# Patient Record
Sex: Male | Born: 1965 | Race: Black or African American | Hispanic: No | Marital: Single | Smoking: Current every day smoker
Health system: Southern US, Community
[De-identification: ages and names within clinical notes are randomized; demographics above are authoritative.]

## PROBLEM LIST (undated history)

## (undated) DIAGNOSIS — I639 Cerebral infarction, unspecified: Secondary | ICD-10-CM

## (undated) DIAGNOSIS — F4310 Post-traumatic stress disorder, unspecified: Secondary | ICD-10-CM

## (undated) HISTORY — PX: BACK SURGERY: SHX140

## (undated) HISTORY — PX: DENTAL SURGERY: SHX609

---

## 1999-12-28 ENCOUNTER — Ambulatory Visit (HOSPITAL_COMMUNITY): Admission: RE | Admit: 1999-12-28 | Discharge: 1999-12-28 | Payer: Self-pay | Admitting: Otolaryngology

## 1999-12-28 ENCOUNTER — Encounter (INDEPENDENT_AMBULATORY_CARE_PROVIDER_SITE_OTHER): Payer: Self-pay | Admitting: Specialist

## 2000-09-04 ENCOUNTER — Ambulatory Visit (HOSPITAL_BASED_OUTPATIENT_CLINIC_OR_DEPARTMENT_OTHER): Admission: RE | Admit: 2000-09-04 | Discharge: 2000-09-04 | Payer: Self-pay | Admitting: Urology

## 2000-09-04 ENCOUNTER — Encounter (INDEPENDENT_AMBULATORY_CARE_PROVIDER_SITE_OTHER): Payer: Self-pay | Admitting: Specialist

## 2000-12-05 ENCOUNTER — Emergency Department (HOSPITAL_COMMUNITY): Admission: EM | Admit: 2000-12-05 | Discharge: 2000-12-05 | Payer: Self-pay | Admitting: *Deleted

## 2001-06-24 ENCOUNTER — Encounter: Payer: Self-pay | Admitting: Emergency Medicine

## 2001-06-24 ENCOUNTER — Emergency Department (HOSPITAL_COMMUNITY): Admission: EM | Admit: 2001-06-24 | Discharge: 2001-06-24 | Payer: Self-pay | Admitting: Emergency Medicine

## 2001-10-28 ENCOUNTER — Encounter: Admission: RE | Admit: 2001-10-28 | Discharge: 2001-10-28 | Payer: Self-pay | Admitting: Neurosurgery

## 2001-10-28 ENCOUNTER — Encounter: Payer: Self-pay | Admitting: Neurosurgery

## 2001-11-12 ENCOUNTER — Encounter: Admission: RE | Admit: 2001-11-12 | Discharge: 2001-11-12 | Payer: Self-pay | Admitting: Neurosurgery

## 2001-11-12 ENCOUNTER — Encounter: Payer: Self-pay | Admitting: Neurosurgery

## 2001-12-09 ENCOUNTER — Encounter: Admission: RE | Admit: 2001-12-09 | Discharge: 2001-12-09 | Payer: Self-pay | Admitting: Neurosurgery

## 2001-12-09 ENCOUNTER — Encounter: Payer: Self-pay | Admitting: Neurosurgery

## 2003-01-21 ENCOUNTER — Encounter: Payer: Self-pay | Admitting: Orthopaedic Surgery

## 2003-01-21 ENCOUNTER — Inpatient Hospital Stay (HOSPITAL_COMMUNITY): Admission: RE | Admit: 2003-01-21 | Discharge: 2003-01-23 | Payer: Self-pay | Admitting: Orthopaedic Surgery

## 2003-01-23 ENCOUNTER — Encounter: Payer: Self-pay | Admitting: Orthopaedic Surgery

## 2006-09-14 ENCOUNTER — Inpatient Hospital Stay (HOSPITAL_COMMUNITY): Admission: EM | Admit: 2006-09-14 | Discharge: 2006-09-19 | Payer: Self-pay | Admitting: Psychiatry

## 2006-09-14 ENCOUNTER — Ambulatory Visit: Payer: Self-pay | Admitting: Psychiatry

## 2006-09-14 ENCOUNTER — Emergency Department (HOSPITAL_COMMUNITY): Admission: EM | Admit: 2006-09-14 | Discharge: 2006-09-14 | Payer: Self-pay | Admitting: Emergency Medicine

## 2009-07-12 ENCOUNTER — Emergency Department (HOSPITAL_COMMUNITY): Admission: EM | Admit: 2009-07-12 | Discharge: 2009-07-12 | Payer: Self-pay | Admitting: Emergency Medicine

## 2009-09-14 ENCOUNTER — Emergency Department (HOSPITAL_COMMUNITY): Admission: EM | Admit: 2009-09-14 | Discharge: 2009-09-14 | Payer: Self-pay | Admitting: Emergency Medicine

## 2011-02-24 NOTE — Op Note (Signed)
[place]  Patient:    [name], [name]                   MRN: [id number] Proc. Date: 09/04/00 Adm. Date:  [id number] Attending:  Lindaann Slough                           Operative Report  PREOPERATIVE DIAGNOSIS:  Sebaceous cyst of left scrotum.  POSTOPERATIVE DIAGNOSIS:  Cyst of left scrotum.  PROCEDURE:  Excisional biopsy of left scrotal cyst.  SURGEON:  Lindaann Slough, M.D.  ANESTHESIA:  General.  INDICATION:  The patient is a 45 year old male who had noticed a swelling of his left scrotum for several months.  The swelling has been increasing in size and has been causing some discomfort.  He was found on physical examination to have a cyst of the left scrotum.  The cyst is independent from the testicle and the cord and well-delineated.  He is scheduled for excisional biopsy of the cyst.  DESCRIPTION OF PROCEDURE:  Under general anesthesia, the patient was prepped and draped and placed in the supine position.  A longitudinal incision was made on the left scrotum over the cyst.  The incision was carried down to the subcutaneous tissues.  The cyst was dissected from the surrounding tissues and excised.  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  Hemostasis was secured with electrocautery.  Then the wound was closed in three layers with 3-0 Vicryl.  The skin was then infiltrated with 0.25% Marcaine.  The patient tolerated the procedure well and left the OR in satisfactory condition to postanesthesia care unit. DD:  09/04/00 TD:  09/04/00 Job: [id number]

## 2011-02-24 NOTE — Discharge Summary (Signed)
[name], [name]            ACCOUNT NO.:  [id number]   MEDICAL RECORD NO.:  [id number]          PATIENT TYPE:  IPS   LOCATION:  0505                          [facility]ILITY[facility]  [facility]   PHYSICIAN:  Geoffery Lyons, M.D.      DATE OF BIRTH:  [birth year]   DATE OF ADMISSION:  09/14/2006  DATE OF DISCHARGE:  09/19/2006                               DISCHARGE SUMMARY   CHIEF COMPLAINT AND PRESENT ILLNESS:  This was the first admission to  [facility] for this 45 year old divorced African-  American male.  Presented to [facility] requesting  help to detox from alcohol and cocaine.  Was sent to [facility] for  clearance.  Looking for detox.  Endorsed being depressed as he could not  do it on his own.  Ended a relationship with a woman who is the mother  of his 52-year-old child.  Reported being hopeless, sleeping either at  his sister's house or at a friend's until he can find somewhere to stay.  Employed as a Museum/gallery exhibitions officer.   PAST PSYCHIATRIC HISTORY:  Denies previous treatment.   ALCOHOL/DRUG HISTORY:  Began drinking at age 1.  Drinks a 12-pack and  liquor every day.  Cocaine, began using at age 87, using five bags a day  for the past 22  years.   MEDICAL HISTORY:  Back surgery in 2003, L5,S1.   MEDICATIONS:  Naproxen.   PHYSICAL EXAMINATION:  Performed and failed to show any acute findings.   LABORATORY DATA:  TSH 3.231.  Drug screen positive for cocaine and  marijuana.  CBC with white blood cells 6.7, hemoglobin 15.0.  Blood  chemistry with sodium 131, potassium 3.7, glucose 94, BUN 14, creatinine  0.9.   MENTAL STATUS EXAM:  Alert, cooperative male.  Speech was normal in  rate, rhythm and tone.  Motor behavior is within normal limits.  He has  good eye contact.  Mood anxious.  Affect broad.  Thought processes are  clear, rational and goal-oriented.  Endorsed he wanted help, wanted to  be a good father to his son.  No evidence of delusions.  No  active  suicidal or homicidal ideation.  No hallucinations.  Cognition was well-  preserved.   ADMISSION DIAGNOSES:  AXIS I:  Alcohol and cocaine dependence.  AXIS II:  No diagnosis.  AXIS III:  Status post back surgery, herniated nucleus pulposus repair  L5, S1.  AXIS IV:  Moderate.  AXIS V:  GAF upon admission 35-40; highest GAF in the last year 60-65.   HOSPITAL COURSE:  He was admitted.  He was started in individual and  group psychotherapy.  He was given Ambien for sleep.  He was detoxified  with Librium.  He was started on Wellbutrin XL 150 mg in the morning.  He was given some Seroquel as needed.  He endorsed he was tired of  using, could not stop on his own.  There is symptomatology suggestive of  a depressive process.  He also endorsed anxiety, restlessness as he was  detoxed.  Endorsed racing thoughts.  There were tremors, shakes,  somewhat isolated and withdrawn.  He endorsed he was under some stress,  working on weekends, taking care of son with 1-year-old while his wife  works.  Wanting to stay abstinent.  Wanted to be there for his son.  The  mother, he claimed, had issues and he was wanting to get himself to a  point where he was the main caretaker.  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  Initially very anxious,  worried about where to go when he was discharged from the unit.  At  times, looking perplexed, having a hard time processing.  We pursued  detox, worked relapse prevention.  On September 18, 2006, still worried  about what will happen once he got out of the hospital but endorsed he  was starting to feel better but was clear that, if he was to go back to  the places where he was staying, he would probably go back to using.  Endorsed that, if he was to go to a residential treatment program, he  could not take care of his child, so he was conflicted.  Continued to  feel that he was going to be better served by going to the [place].   There  was still looks of being perplexed, at times seemed to be more  overwhelmed.  In the next 24 hours, there was some change in his  behavior.  He was more with it.  He was still endorsing concerns about  where to go from here but he was requesting discharge as he was worried  about his son.  On September 19, 2006, he was in full contact with  reality.  Denied any active suicidal or homicidal ideation.  Fully  detoxed.  No withdrawal.  Still worried about his son.  Would not commit  to doing anything that would keep him unable to take care of his child.  Willing to follow up on an outpatient basis.   DISCHARGE DIAGNOSES:  AXIS I:  Alcohol and cocaine dependence.  Depressive disorder not otherwise specified.  AXIS II:  No diagnosis.  AXIS III:  Status post back surgery.  AXIS IV:  Moderate.  AXIS V:  GAF upon discharge 50-55.   DISCHARGE MEDICATIONS:  1. Wellbutrin XL 150 mg per day.  2. Symmetrel 100 mg twice a day.   FOLLOWUP:  [facility].      Geoffery Lyons, M.D.  Electronically Signed     IL/MEDQ  D:  10/11/2006  T:  10/11/2006  Job:  [id number]

## 2011-02-24 NOTE — Op Note (Signed)
NAME:  [name], [name]                      ACCOUNT NO.:  [id number]   MEDICAL RECORD NO.:  [id number]                   PATIENT TYPE:  INP   LOCATION:  2550                                 FACILITY:  [facility]   PHYSICIAN:  Sharolyn Douglas, M.D.                     DATE OF BIRTH:  25-Sep-1966   DATE OF PROCEDURE:  01/20/2002  DATE OF DISCHARGE:                                 OPERATIVE REPORT   PREOPERATIVE DIAGNOSES:  1. L5-S1 herniated nucleus pulposus with chronic bilateral lower extremity     pain, left greater than right.  2. L5-S1 degenerative disk disease and lumbago.   POSTOPERATIVE DIAGNOSES:  1. L5-S1 herniated nucleus pulposus with chronic bilateral lower extremity     pain, left greater than right.  2. L5-S1 degenerative disk disease and lumbago.   PROCEDURE:  1. L5-S1 left hemilaminotomy and diskectomy with decompression of the left     L5 and S1 nerve roots.  2. Transforaminal lumbar interbody fusion with placement of an 11 mm Spinal     Concepts peak prosthetic cage.  3. Minimally invasive pedicle screw instrumentation L5-S1 utilizing the     Pathfinder system.  4. Posterior spinal arthrodesis, L5-S1.  5. Local autogenous bone graft supplemented with pro-osteal antibiotic bone     graft substitute and platelet gel.  6. Neurological monitoring utilizing triggered and free-running EMGs with     testing of 4 pedicle screws and  monitoring of free-running EMGs for 3     hours.   SURGEON:  Sharolyn Douglas, M.D.   ASSISTANT:  Junious Dresser __________ P.A.   ANESTHESIA:  General endotracheal anesthesia.   COMPLICATIONS:  None.   INDICATIONS FOR PROCEDURE:  The patient is a very pleasant 45 year old male  who injured his back in September 2002 at work. He has had a long and  appropriate course of conservative treatment with both Dr. Darcella Cheshire the  orthopedist as well as Dr. Trey Sailors, a  neurosurgeon. He has significant  back pain with bilateral lower extremity radiation  including the left  buttock and leg with radiation into the lateral thigh, leg, and foot.   Plain radiograph showed disk space narrowing at L5-S1. An MRI scan showed a  large central herniation which lateralizes to the left. There are modic  endplate changes and L5-S1 disk space narrowing as well as foraminal  stenosis, left greater than right. He has weakness in the left extensor  hallucis longus. He has been taking escalating doses of narcotics, failed  epidural steroid injections and physical therapy and has now elected to  undergo minimally invasive L5-S1 decompression and fusion in hopes of  improving his symptoms.   DESCRIPTION OF PROCEDURE:  The patient was properly identified in the  holding area and taken to the operating room. He underwent general  endotracheal anesthesia without difficulty. He was given prophylactic IV  antibiotics. He was carefully turned prone  onto the Ocumed four poster  positioning frame. All bony prominences were padded. The face and eyes were  protected at all times.   Biplanar fluoroscopy was brought into the field. We obtained true AP and  lateral images of the L5 and S1 levels. The skin was marked over the L5-S1  pedicles bilaterally.   A 4-cm incision was made bilaterally after injecting the skin with 1%  Lidocaine with epinephrine. The deep fascia was incised. We then used  Jamshidi needles to cannulate the L5 and S1 pedicles bilaterally. Guidewires  were placed through the Jamshidi needles.   Once the guidewires were in position we utilized triggered EMGs to confirm  intraosseous placement. In each case, we had stimulation of greater than 20  milliamps consistent with well placed wires within the pedicles. We then  used an awl over the wire followed by a tap. This was all done under direct  biplanar fluoroscopy.   We then placed 6.5 x 50-mm screws at L5 and 7.5 x 45-mm screws at S1. It  should be noted that after the pedicle was tapped, the  tap itself was then  stimulated using the triggered EMGs. Again we a response of greater than 20  mA in each case. After placing our pedicle screws, 5-mm rods were placed  through the chutes on the ends of the screws and the appropriate locking  caps were placed.   At this point, we turned our attention to performing a decompression and  diskectomy on the left at L5-S1. We placed the __________ retractor over the  L5-S1 interspace utilizing the previous fascial incision on the left side.  We identified the landmarks, including the lamina and L5-S1 facet joint. We  used a high speed bur to remove the inferior 1/3rd of the L5 lamina as well  as the medial portion of the L5-S1 facet joint.   The ligamentum flavum was elevated. We identified the transversing S1 nerve  root. This was gently retracted medially. The lateral recess was narrowed  secondary to the bulging disk as well as the overhanging S1 facet. The  overhanging S1 facet was removed utilizing the Kerrison punches, flush with  the S1 pedicle.   We then performed a foraminotomy at L5-S1, completely decompressing the L5  nerve root. We identified the central bulging disk and a sharp annulotomy  was performed. Using Epstein curets, we were able to push the disk back into  the interspace and remove it using pituitaries. This material appeared to be  very chronic and partially calcified. The disk material itself was very  degenerative.   We then used specialized instruments of the Spinal Concepts __________ to  perform a radical diskectomy all the way across the other side. The  vertebral endplates were scraped and the cartilage was removed.   At this point, we performed a transforaminal lumbar interbody fusion. We  copiously irrigated the disk space. We then collected the local autogenous  bone which had been cleaned and morselized from the lamina and the  facetectomy. This was mixed with 10 mL of __________ bone graft  substitute. A log was made utilizing the platelet gel.  A funnel was utilized to tightly  pack the disk space with the bone graft. It became evident that the disk  space was completely packed with the bone graft material.   We then placed an 11 mm peak prosthetic spacer into the disk space and  carefully tamped it across to the midline. AP and lateral fluoroscopy  was  utilized to confirm good positioning of the prosthesis. We once again  evaluated the L5 and S1 nerve roots now that they were free. The wound was  copiously irrigated. Gelfoam was left over the exposed epidural space.   We then turned our attention to performing a posterior fusion on the right  side of the L5 -S1 facet joint. The joint capsule was removed. The  articulate cartilage was removed using the high-speed bur. The remaining  bone graft was packed over the facet joint on the right.   We applied general compression across the L5-S1 level utilizing the pedicle  screws and rods. The final caps were tightened and sheared off. We took  final AP and lateral images which showed excellent position of the pedicle  screws and interbody prosthesis. It should be noted that throughout the  procedure the free running EMGs were monitored. There were no deleterious  changes.   The 2 small incisions were then closed using a #1 Vicryl on the fascia  followed by 2-0 Vicryl in the subcutaneous layer and then a running 3-0  subcuticular Vicryl suture on the skin. Benzoin and Steri-Strips were  placed. A sterile dressing was applied.   The patient was turned supine and extubated without difficulty. He was  transferred to the recovery room in stable condition. He was able to move  his upper and lower extremities.                                               Sharolyn Douglas, M.D.    MC/MEDQ  D:  01/21/2003  T:  01/21/2003  Job:  [id number]

## 2011-02-24 NOTE — H&P (Signed)
NAME:  [name], [name]                      ACCOUNT NO.:  [id number]   MEDICAL RECORD NO.:  [id number]                   PATIENT TYPE:  INP   LOCATION:  2550                                 FACILITY:  [facility]   PHYSICIAN:  Sharolyn Douglas, M.D.                     DATE OF BIRTH:  07-26-1966   DATE OF ADMISSION:  01/21/2003  DATE OF DISCHARGE:                                HISTORY & PHYSICAL   CHIEF COMPLAINT:  Back and left lower extremity pain.   HISTORY OF PRESENT ILLNESS:  The patient is a 45 year old male who has been  having low back as well as left lower extremity pain for greater than two  years now following an injury that did occur at work.  The pain has gotten  to the point that it is progressively getting worse.  It has failed numerous  __________  management including activity modification, anti-inflammatory  medications, pain medications, injections, as well as physical therapy.  The  pain has gotten more severe in nature.  It is constant.  It is significantly  affecting his quality of life as well as activities of daily living.  He has  been unable to work secondary to this pain.  The risks and benefits of  surgery were discussed with patient by Dr. Sharolyn Douglas.  He indicated  understanding and opts to proceed.   ALLERGIES:  No known drug allergies.   MEDICATIONS:  1. Xanax p.r.n.  2. Percocet p.r.n.   PAST MEDICAL HISTORY:  Healthy.   PAST SURGICAL HISTORY:  Nasal surgery.   SOCIAL HISTORY:  The patient quit smoking on 01/09/03.  Denies alcohol use.  He is married and has two children.  His family is going to be available to  help him through his postoperative course.   FAMILY MEDICAL HISTORY:  Noncontributory.   REVIEW OF SYSTEMS:  CONSTITUTIONAL:  The patient denies any fevers, chills,  sweats, or bleeding gums.  CNS:  Denies blurred vision, double vision,  seizures, headaches.  CARDIOVASCULAR:  Denies chest pain, angina, orthopnea,  claudication, or  palpitations.  PULMONARY:  Denies shortness of breath,  productive cough, hemoptysis.  GI:  Denies nausea, vomiting, constipation,  diarrhea, melena, or bloody stool.  GU:  Denies dysuria, hematuria or  discharge.  MUSCULOSKELETAL:  As per HPI.   PHYSICAL EXAMINATION:  VITAL SIGNS:  Blood pressure 120/86, respirations 16  and unlabored, pulse 86 and regular.  GENERAL:  The patient is a 45 year old black male who is alert, oriented and  in no acute distress.  He is well nourished and well groomed.  Appears  stated age.  Pleasant and cooperative through the examination.  He does have  difficulty moving about the examination room secondary to pain.  HEENT:  Head:  Normocephalic and atraumatic.  Pupils equally round and  reactive.  Extraocular movements intact.  Nares patent.  Pharynx clear.  NECK:  Soft to palpation.  No bruits appreciated.  No lymphadenopathy or  thyromegaly noted.  CHEST:  Clear to auscultation bilaterally.  No rales, rhonchi, stridor,  wheezes.  BREASTS:  Not pertinent, not performed.  HEART:  S1/S2 regular rate and rhythm.  No murmurs, gallops or rubs noted.  ABDOMEN:  Soft to palpation.  Positive bowel sounds.  Nontender,  nondistended, no organomegaly noted.  GU:  Not pertinent, not performed.  EXTREMITIES:  Motor function grossly intact.  Sensation grossly intact.  See  notes for a detailed exam.  He does have left lower extremity pain, positive  dorsalis pedis and posterior tibialis pulses.  SKIN:  Intact without lesions or rashes.   RADIOLOGIC DATA:  X-ray and MRI show degenerative disk disease as well as an  HNP at L5-S1.   IMPRESSION:  Degenerative disk disease and a herniated nucleus pulposis at  L5-S1.   PLAN:  Admit to [facility] on 01/21/03 for a minimally-invasive  posterior spinal infusion at L5-S1 with a __________  procedure and this  will be done by Dr. Sharolyn Douglas.     Verlin Fester, P.A.                       Sharolyn Douglas, M.D.     CM/MEDQ  D:  01/21/2003  T:  01/21/2003  Job:  [id number]

## 2011-02-24 NOTE — H&P (Signed)
[name], [name]            ACCOUNT NO.:  [id number]   MEDICAL RECORD NO.:  [id number]          PATIENT TYPE:  IPS   LOCATION:  0505                          [facility]ILITY[facility]  [facility]   PHYSICIAN:  [name], M.D.      DATE OF BIRTH:  Aug 29, 1966   DATE OF ADMISSION:  09/14/2006  DATE OF DISCHARGE:                       PSYCHIATRIC ADMISSION ASSESSMENT   This is a voluntary admission to the services of Dr. Geoffery Bullock.   IDENTIFYING INFORMATION:  This is a 45 year old divorced African-  American male.  Apparently he presented to [facility] mental health  yesterday requesting help to detox from alcohol and cocaine.  He was  sent to the [name] for medical clearance.  He is looking for a  detox and is depressed because he can not do it on his own.  He recently  ended a relationship with a woman who is the mother of his 4-year-old  child.  He reports being homeless, sleeping either at his sister's house  or at a friend's until he can find somewhere to stay.  He is employed as  a Museum/gallery exhibitions officer.   PAST PSYCHIATRIC HISTORY:  He has no prior in or outpatient treatment.   SOCIAL HISTORY:  He graduated high school in 1983.  His is currently  employed as a Estate agent.  He is married x1 and divorced.  He  does have a 41 year old daughter, and a 84 year old daughter from that  marriage.  He currently has a 74-year-old son from his girlfriend.   FAMILY HISTORY:  He states all the men drink heavily.  He himself began  drinking at age 55.  He acknowledges drinking a 12-pack and liquor every  day.  Cocaine, he began using at age 82.  He is using 5 bags a day for  the past 22 years.   PRIMARY CARE [name]:  [facility] [place].  He is on the [facility] Team in [place].   MEDICAL PROBLEMS:  He did have back surgery here at [facility] January 20, 2002.  He had an HNP at L5-S1.   MEDICATIONS:  He states he currently takes naproxen from the [place].   DRUG ALLERGIES:  NO KNOWN DRUG  ALLERGIES.   POSITIVE PHYSICAL FINDINGS:  He was cleared in the ED at [facility].  His alcohol level was less than 5.  His urine drug screen was positive  for cocaine and marijuana.  His other labs were within normal limits,  and his vital signs on admission to our unit show he is 72 inches tall,  weight is 168, temperature is 97.3, blood pressure is 135/101 to 130/84,  pulse 70, and respirations are 20.  He does have a back surgery scar in  the low lumbar area, and he had no other remarkable findings at this  time.   MENTAL STATUS EXAM:  He is alert and oriented x3.  His speech is normal  rate, rhythm, and tone.  His motor is normal.  He had good eye contact.  His mood is appropriate to the situation.  His affect has a range.  Thought processes  are clear, rational, and goal-oriented.  He was to get  help.  He wants to be a good father to his son.  Judgment and insight  are intact.  Concentration and memory are intact.  Intelligence is at  least average.  He denies being suicidal or homicidal.  He denies  auditory/visual hallucinations.   DIAGNOSES:  Axis I:  Alcohol and cocaine dependence.  Axis II:  No diagnosis.  Axis III:  Herniated nucleus pulposus repair, L5-S1, January 20, 2002.  Axis IV:  He is currently homeless.  Axis V:  60.   PLAN:  Admit to support through detox.  Toward that end, we will go  ahead and start him on the low-dose Librium protocol.  He is a Cytogeneticist.  We can attempt to get him into the [facility] program in [place].  Also,  they have a homeless vet program, and we will let the case managers work  on that.  We can go ahead and start some Wellbutrin to address his  depression, and to help with withdrawal from smoking.      Mickie Leonarda Salon, P.A.-C.      [name], M.D.  Electronically Signed    MD/MEDQ  D:  09/15/2006  T:  09/15/2006  Job:  [id number]

## 2011-03-02 ENCOUNTER — Emergency Department (HOSPITAL_COMMUNITY): Payer: Non-veteran care

## 2011-03-02 ENCOUNTER — Emergency Department (HOSPITAL_COMMUNITY)
Admission: EM | Admit: 2011-03-02 | Discharge: 2011-03-02 | Disposition: A | Discharge status: Home / Self Care | Payer: Non-veteran care | Attending: Emergency Medicine | Admitting: Emergency Medicine

## 2011-03-02 DIAGNOSIS — IMO0002 Reserved for concepts with insufficient information to code with codable children: Secondary | ICD-10-CM | POA: Insufficient documentation

## 2011-03-02 DIAGNOSIS — M545 Low back pain, unspecified: Secondary | ICD-10-CM | POA: Insufficient documentation

## 2011-03-02 DIAGNOSIS — M25519 Pain in unspecified shoulder: Secondary | ICD-10-CM | POA: Insufficient documentation

## 2011-03-02 DIAGNOSIS — S335XXA Sprain of ligaments of lumbar spine, initial encounter: Secondary | ICD-10-CM | POA: Insufficient documentation

## 2011-06-22 ENCOUNTER — Emergency Department (HOSPITAL_COMMUNITY): Payer: Worker's Compensation

## 2011-06-22 ENCOUNTER — Emergency Department (HOSPITAL_COMMUNITY)
Admission: EM | Admit: 2011-06-22 | Discharge: 2011-06-22 | Disposition: A | Discharge status: Home / Self Care | Payer: Worker's Compensation | Attending: Emergency Medicine | Admitting: Emergency Medicine

## 2011-06-22 DIAGNOSIS — R110 Nausea: Secondary | ICD-10-CM | POA: Insufficient documentation

## 2011-06-22 DIAGNOSIS — G8929 Other chronic pain: Secondary | ICD-10-CM | POA: Insufficient documentation

## 2011-06-22 DIAGNOSIS — R51 Headache: Secondary | ICD-10-CM | POA: Insufficient documentation

## 2011-06-22 DIAGNOSIS — R4701 Aphasia: Secondary | ICD-10-CM | POA: Insufficient documentation

## 2011-06-22 DIAGNOSIS — M549 Dorsalgia, unspecified: Secondary | ICD-10-CM | POA: Insufficient documentation

## 2011-06-22 DIAGNOSIS — R42 Dizziness and giddiness: Secondary | ICD-10-CM | POA: Insufficient documentation

## 2011-06-22 DIAGNOSIS — I10 Essential (primary) hypertension: Secondary | ICD-10-CM | POA: Insufficient documentation

## 2011-06-22 LAB — URINALYSIS, ROUTINE W REFLEX MICROSCOPIC
Glucose, UA: NEGATIVE mg/dL
Leukocytes, UA: NEGATIVE
Nitrite: NEGATIVE
Specific Gravity, Urine: 1.004 — ABNORMAL LOW (ref 1.005–1.030)
pH: 6.5 (ref 5.0–8.0)

## 2011-06-22 LAB — PRO B NATRIURETIC PEPTIDE: Pro B Natriuretic peptide (BNP): 13.4 pg/mL (ref 0–125)

## 2011-06-22 LAB — DIFFERENTIAL
Basophils Relative: 1 % (ref 0–1)
Eosinophils Absolute: 0 10*3/uL (ref 0.0–0.7)
Lymphs Abs: 2.1 10*3/uL (ref 0.7–4.0)
Neutro Abs: 2.2 10*3/uL (ref 1.7–7.7)
Neutrophils Relative %: 48 % (ref 43–77)

## 2011-06-22 LAB — COMPREHENSIVE METABOLIC PANEL
ALT: 22 U/L (ref 0–53)
AST: 26 U/L (ref 0–37)
Albumin: 4.1 g/dL (ref 3.5–5.2)
Alkaline Phosphatase: 63 U/L (ref 39–117)
Chloride: 98 mEq/L (ref 96–112)
Creatinine, Ser: 0.85 mg/dL (ref 0.50–1.35)
Potassium: 4.4 mEq/L (ref 3.5–5.1)
Sodium: 137 mEq/L (ref 135–145)
Total Bilirubin: 0.3 mg/dL (ref 0.3–1.2)

## 2011-06-22 LAB — CBC
Hemoglobin: 14.3 g/dL (ref 13.0–17.0)
Platelets: 163 10*3/uL (ref 150–400)
RBC: 5.16 MIL/uL (ref 4.22–5.81)
WBC: 4.6 10*3/uL (ref 4.0–10.5)

## 2012-10-01 ENCOUNTER — Emergency Department (HOSPITAL_COMMUNITY)
Admission: EM | Admit: 2012-10-01 | Discharge: 2012-10-01 | Discharge status: Against Medical Advice | Payer: Non-veteran care | Attending: Emergency Medicine | Admitting: Emergency Medicine

## 2012-10-01 ENCOUNTER — Emergency Department (HOSPITAL_COMMUNITY)
Admission: EM | Admit: 2012-10-01 | Discharge: 2012-10-01 | Disposition: A | Discharge status: Home / Self Care | Payer: Non-veteran care | Source: Home / Self Care | Attending: Emergency Medicine | Admitting: Emergency Medicine

## 2012-10-01 ENCOUNTER — Encounter (HOSPITAL_COMMUNITY): Payer: Self-pay | Admitting: *Deleted

## 2012-10-01 ENCOUNTER — Encounter (HOSPITAL_COMMUNITY): Payer: Self-pay | Admitting: Cardiology

## 2012-10-01 ENCOUNTER — Emergency Department (HOSPITAL_COMMUNITY): Payer: Non-veteran care

## 2012-10-01 DIAGNOSIS — S0083XA Contusion of other part of head, initial encounter: Secondary | ICD-10-CM

## 2012-10-01 DIAGNOSIS — S0993XA Unspecified injury of face, initial encounter: Secondary | ICD-10-CM

## 2012-10-01 DIAGNOSIS — Y929 Unspecified place or not applicable: Secondary | ICD-10-CM | POA: Insufficient documentation

## 2012-10-01 DIAGNOSIS — F17200 Nicotine dependence, unspecified, uncomplicated: Secondary | ICD-10-CM | POA: Insufficient documentation

## 2012-10-01 DIAGNOSIS — X58XXXA Exposure to other specified factors, initial encounter: Secondary | ICD-10-CM | POA: Insufficient documentation

## 2012-10-01 DIAGNOSIS — S022XXA Fracture of nasal bones, initial encounter for closed fracture: Secondary | ICD-10-CM | POA: Insufficient documentation

## 2012-10-01 DIAGNOSIS — Y939 Activity, unspecified: Secondary | ICD-10-CM | POA: Insufficient documentation

## 2012-10-01 DIAGNOSIS — S298XXA Other specified injuries of thorax, initial encounter: Secondary | ICD-10-CM | POA: Insufficient documentation

## 2012-10-01 DIAGNOSIS — S1093XA Contusion of unspecified part of neck, initial encounter: Secondary | ICD-10-CM | POA: Insufficient documentation

## 2012-10-01 DIAGNOSIS — S0003XA Contusion of scalp, initial encounter: Secondary | ICD-10-CM | POA: Insufficient documentation

## 2012-10-01 MED ORDER — HYDROCODONE-ACETAMINOPHEN 5-325 MG PO TABS: 2.0000 | ORAL_TABLET | ORAL | Status: DC | PRN
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
## 2012-10-01 NOTE — ED Notes (Signed)
Went to [facility], sleeping on stretcher at present,

## 2012-10-01 NOTE — ED Notes (Signed)
Pt escorted to CT

## 2012-10-01 NOTE — ED Notes (Signed)
Facial swelling with cuts anf his rt eye is totally swelled  Together.  He has a loose upper central or lateral incisor.  He has just walked out from the back and checked back in for the same
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
## 2012-10-01 NOTE — ED Provider Notes (Signed)
The patient returned after eloping an hour earlier.  He had his tests performed and vision tested.  Optho consult was obtained over the phone.  He is to follow up with them on the 26th.  Geoffery Lyons, MD 10/01/12 [phone]

## 2012-10-01 NOTE — ED Notes (Signed)
MD at bedside. 
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
## 2012-10-01 NOTE — ED Notes (Signed)
The patient is AOx4 and comfortable with his discharge instructions. 

## 2012-11-04 ENCOUNTER — Emergency Department (HOSPITAL_COMMUNITY)
Admission: EM | Admit: 2012-11-04 | Discharge: 2012-11-04 | Disposition: A | Discharge status: Home / Self Care | Payer: Non-veteran care | Attending: Emergency Medicine | Admitting: Emergency Medicine

## 2012-11-04 ENCOUNTER — Encounter (HOSPITAL_COMMUNITY): Payer: Self-pay | Admitting: *Deleted

## 2012-11-04 DIAGNOSIS — Z79899 Other long term (current) drug therapy: Secondary | ICD-10-CM | POA: Insufficient documentation

## 2012-11-04 DIAGNOSIS — F17200 Nicotine dependence, unspecified, uncomplicated: Secondary | ICD-10-CM | POA: Insufficient documentation

## 2012-11-04 DIAGNOSIS — G8929 Other chronic pain: Secondary | ICD-10-CM | POA: Insufficient documentation

## 2012-11-04 DIAGNOSIS — Z9889 Other specified postprocedural states: Secondary | ICD-10-CM | POA: Insufficient documentation

## 2012-11-04 DIAGNOSIS — M545 Low back pain, unspecified: Secondary | ICD-10-CM | POA: Insufficient documentation

## 2012-11-04 MED ORDER — OXYCODONE-ACETAMINOPHEN 5-325 MG PO TABS
1.0000 | ORAL_TABLET | Freq: Once | ORAL | Status: AC
  Administered 2012-11-04: 1 via ORAL
  Filled 2012-11-04: qty 1

## 2012-11-04 MED ORDER — OXYCODONE-ACETAMINOPHEN 5-325 MG PO TABS: 1.0000 | ORAL_TABLET | ORAL | Status: DC | PRN

## 2012-11-04 NOTE — ED Provider Notes (Signed)
History  This chart was scribed for Dione Booze, MD by Marlin Canary ED Scribe. The patient was seen in room TR09C/TR09C. Patient's care was started at 2237.  CSN: [id number]  Arrival date & time 11/04/12  2207   First MD Initiated Contact with Patient 11/04/12 2237      Chief Complaint  Patient presents with  . Back Pain   The history is provided by the patient. No language interpreter was used.   [name] is a 47 y.o. male with chronic back pain who presents to the Emergency Department complaining of constant severe left lower back pain. He reports a h/o a back surgery and states his pain flared up about 3 hours ago. He states the pain radiates down his left leg which is normal for his back pain. His pain is rated 10/10 at it's worst. His pain is aggravated with movement and relieved by laying down. He denies any recent falls, injuries or heavy lifting. He denies any weakness, numbness or tingling. He normally takes Flexeril and Meloxicam for his back pain.  History reviewed. No pertinent past medical history.  Past Surgical History  Procedure Date  . Back surgery     Family History  Problem Relation Age of Onset  . Depression Father     History  Substance Use Topics  . Smoking status: Current Every Day Smoker -- 0.2 packs/day for 34 years  . Smokeless tobacco: Never Used  . Alcohol Use: 3.6 oz/week    6 Cans of beer per week      Review of Systems  Musculoskeletal: Positive for back pain. Negative for gait problem.  Neurological: Negative for weakness and numbness.  All other systems reviewed and are negative.    Allergies  Bee venom and Citrus  Home Medications   Current Outpatient Rx  Name  Route  Sig  Dispense  Refill  . CYCLOBENZAPRINE HCL 10 MG PO TABS   Oral   Take 10 mg by mouth at bedtime.         . MELOXICAM 15 MG PO TABS   Oral   Take 15 mg by mouth daily.           BP 144/112  Pulse 102  Temp 98.3 F (36.8 C) (Oral)   Resp 16  SpO2 100%  Physical Exam  Nursing note and vitals reviewed. Constitutional: He is oriented to person, place, and time. He appears well-developed and well-nourished. No distress.  HENT:  Head: Normocephalic and atraumatic.  Eyes: Conjunctivae normal and EOM are normal.  Neck: Neck supple. No tracheal deviation present.  Cardiovascular: Normal rate, regular rhythm and normal heart sounds.   Pulmonary/Chest: Effort normal and breath sounds normal. No respiratory distress.  Musculoskeletal: Normal range of motion. He exhibits tenderness.       Mild left para lumbar tenderness and muscle spasms. Positive SLR on the left at 45 degrees.   Neurological: He is alert and oriented to person, place, and time.  Skin: Skin is warm and dry.  Psychiatric: He has a normal mood and affect. His behavior is normal.    ED Course  Procedures (including critical care time) DIAGNOSTIC STUDIES: Oxygen Saturation is 100% on room air, Normal  by my interpretation.    COORDINATION OF CARE: 2245-Patient informed of current plan for treatment and evaluation and agrees with plan at this time. Will d/c pt home with prescription for Percocet.    1. Exacerbation of chronic back pain  MDM  Exacerbation of chronic back pain. His old records are reviewed and he does not use the ED for back problems her frequently. His record on the [place] controlled substance reporting website was reviewed and he has no narcotic prescriptions the last 6 months. He is already on NSAIDs and muscle relaxers. He is sent home with a prescription for Percocet.   I personally performed the services described in this documentation, which was scribed in my presence. The recorded information has been reviewed and is accurate.      Dione Booze, MD 11/04/12 [phone]

## 2012-11-04 NOTE — ED Notes (Signed)
Pt reports chronic back pain.  Pt unable to get into the [place] so he came to the ED.  (L) back pain radiating down into his legs.

## 2012-11-23 ENCOUNTER — Other Ambulatory Visit: Payer: Self-pay

## 2013-08-14 ENCOUNTER — Other Ambulatory Visit: Payer: Self-pay

## 2017-11-09 ENCOUNTER — Emergency Department (HOSPITAL_COMMUNITY): Payer: Medicaid Other

## 2017-11-09 ENCOUNTER — Emergency Department (HOSPITAL_COMMUNITY)
Admission: EM | Admit: 2017-11-09 | Discharge: 2017-11-09 | Disposition: A | Discharge status: Home / Self Care | Payer: Medicaid Other | Attending: Emergency Medicine | Admitting: Emergency Medicine

## 2017-11-09 ENCOUNTER — Encounter (HOSPITAL_COMMUNITY): Payer: Self-pay | Admitting: Emergency Medicine

## 2017-11-09 DIAGNOSIS — F17210 Nicotine dependence, cigarettes, uncomplicated: Secondary | ICD-10-CM | POA: Insufficient documentation

## 2017-11-09 DIAGNOSIS — Y929 Unspecified place or not applicable: Secondary | ICD-10-CM | POA: Insufficient documentation

## 2017-11-09 DIAGNOSIS — W1830XA Fall on same level, unspecified, initial encounter: Secondary | ICD-10-CM | POA: Insufficient documentation

## 2017-11-09 DIAGNOSIS — S40012A Contusion of left shoulder, initial encounter: Secondary | ICD-10-CM | POA: Insufficient documentation

## 2017-11-09 DIAGNOSIS — Y939 Activity, unspecified: Secondary | ICD-10-CM | POA: Diagnosis not present

## 2017-11-09 DIAGNOSIS — S4992XA Unspecified injury of left shoulder and upper arm, initial encounter: Secondary | ICD-10-CM | POA: Diagnosis present

## 2017-11-09 DIAGNOSIS — Y999 Unspecified external cause status: Secondary | ICD-10-CM | POA: Insufficient documentation

## 2017-11-09 MED ORDER — CYCLOBENZAPRINE HCL 10 MG PO TABS
10.0000 mg | ORAL_TABLET | Freq: Once | ORAL | Status: AC
  Administered 2017-11-09: 10 mg via ORAL
  Filled 2017-11-09: qty 1

## 2017-11-09 MED ORDER — CYCLOBENZAPRINE HCL 10 MG PO TABS: 10.0000 mg | ORAL_TABLET | Freq: Two times a day (BID) | ORAL | 0 refills | Status: DC | PRN

## 2017-11-09 MED ORDER — OXYCODONE-ACETAMINOPHEN 5-325 MG PO TABS
1.0000 | ORAL_TABLET | Freq: Once | ORAL | Status: AC
  Administered 2017-11-09: 1 via ORAL
  Filled 2017-11-09: qty 1

## 2017-11-09 NOTE — Discharge Instructions (Signed)
[facility] continue to take your meloxicam as directed. Only wear the sling for a few days and be sure to do the rang of motion exercises.

## 2017-11-09 NOTE — ED Triage Notes (Signed)
Patient reports that he fell 3-4 days ago and landed on left shoulder. Patient having pains in left shoulder esp with movement. Patient also reports that now his right shoulder is starting to hurt with movement.

## 2017-11-09 NOTE — ED Provider Notes (Signed)
[facility]-EMERGENCY DEPT Provider Note   CSN: [id number] Arrival date & time: 11/09/17  1214     History   Chief Complaint Chief Complaint  Patient presents with  . Shoulder Pain    HPI [name] is a 52 y.o. male who presents to the ED with shoulder pain. The pain started 4 days ago after a fall. The pain is located in the left shoulder. The pain increases with movement. Patient reports that he has problems with his back and knees and that is why he fell. He is treated at [facility] with Mobic and has been taking it with only minimal relief. Patient reports he is also treated with Percocet and Flexeril but he is out of those medications. Patient reports that his BP is up today but he thinks due to pain. He doctor monitors his BP and has not started him on medication.   The history is provided by the patient. No language interpreter was used.  Shoulder Pain   This is a new problem. The current episode started more than 2 days ago. The problem has been gradually worsening. The pain is present in the left shoulder. The pain is moderate. There has been a history of trauma.    History reviewed. No pertinent past medical history.  There are no active problems to display for this patient.   Past Surgical History:  Procedure Laterality Date  . BACK SURGERY         Home Medications    Prior to Admission medications   Medication Sig Start Date End Date Taking? Authorizing Provider  cyclobenzaprine (FLEXERIL) 10 MG tablet Take 1 tablet (10 mg total) by mouth 2 (two) times daily as needed for muscle spasms. 11/09/17   Janne Napoleon, NP  meloxicam (MOBIC) 15 MG tablet Take 15 mg by mouth daily.    [provider]  oxyCODONE-acetaminophen (PERCOCET/ROXICET) 5-325 MG per tablet Take 1 tablet by mouth every 4 (four) hours as needed for pain. 11/04/12   Dione Booze, MD    Family History Family History  Problem Relation Age of Onset  . Depression Father       Social History Social History   Tobacco Use  . Smoking status: Current Every Day Smoker    Packs/day: 0.20    Years: 34.00    Pack years: 6.80  . Smokeless tobacco: Never Used  Substance Use Topics  . Alcohol use: Yes    Alcohol/week: 3.6 oz    Types: 6 Cans of beer per week  . Drug use: No     Allergies   Bee venom and Citrus   Review of Systems Review of Systems  Constitutional: Negative for chills and fever.  HENT: Negative.   Eyes: Negative for pain, itching and visual disturbance.  Respiratory: Negative for cough and shortness of breath.   Gastrointestinal: Negative for abdominal pain, nausea and vomiting.  Genitourinary: Negative for dysuria, frequency and urgency.  Musculoskeletal: Positive for arthralgias. Back pain: chronic   Skin: Negative for wound.  Neurological: Negative for syncope and headaches.  Psychiatric/Behavioral: Negative for confusion.     Physical Exam Updated Vital Signs BP (!) 152/109 (BP Location: Right Arm)   Pulse 70   Temp 97.6 F (36.4 C) (Oral)   Resp 17   SpO2 99%   Physical Exam  Constitutional: He is oriented to person, place, and time. He appears well-developed and well-nourished. No distress.  HENT:  Head: Normocephalic and atraumatic.  Eyes: EOM are  normal.  Neck: Neck supple.  Cardiovascular: Normal rate.  Pulmonary/Chest: Effort normal.  Abdominal: Soft. There is no tenderness.  Musculoskeletal:       Left shoulder: He exhibits decreased range of motion, tenderness and spasm. He exhibits no effusion, no crepitus, no deformity, no laceration, normal pulse and normal strength.  Radial pulses 2+, adequate circulation, grips are equal. Pain with passive rang of motion of the shoulder.   Neurological: He is alert and oriented to person, place, and time. No cranial nerve deficit.  Skin: Skin is warm and dry.  Psychiatric: He has a normal mood and affect.  Nursing note and vitals reviewed.    ED Treatments /  Results  Labs (all labs ordered are listed, but only abnormal results are displayed) Labs Reviewed - No data to display  Radiology Dg Shoulder Left  Result Date: 11/09/2017 CLINICAL DATA:  Fall 3-4 days ago.  Landed on LEFT shoulder. EXAM: LEFT SHOULDER - 2+ VIEW COMPARISON:  None. FINDINGS: There is no evidence of fracture or dislocation. There is no evidence of arthropathy or other focal bone abnormality. Soft tissues are unremarkable. IMPRESSION: Negative. Electronically Signed   By: Elsie StainJohn T Curnes M.D.   On: 11/09/2017 13:06    Procedures Procedures (including critical care time)  Medications Ordered in ED Medications  oxyCODONE-acetaminophen (PERCOCET/ROXICET) 5-325 MG per tablet 1 tablet (1 tablet Oral Given 11/09/17 1253)  cyclobenzaprine (FLEXERIL) tablet 10 mg (10 mg Oral Given 11/09/17 1253)     Initial Impression / Assessment and Plan / ED Course  I have reviewed the triage vital signs and the nursing notes. SUBJECTIVE: [name] is a 52 y.o. male who sustained a left shoulder injury 4 day(s) ago. Mechanism of injury: fall Immediate symptoms: pain and increased pain since that time.  OBJECTIVE: Vital signs as noted above. Discussed with patient elevated BP and need for f/u. Appearance: alert, well appearing, and in no distress. Shoulder exam: soft tissue tenderness, muscle spasm and, reduced range of motion of the left shoulder. X-ray: no fracture or dislocation noted.  ASSESSMENT: Shoulder pain s/p fall.   PLAN: rest the injured area as much as practical, apply ice packs, use a sling and f/u with PCP. Patient agrees with plan.    Final Clinical Impressions(s) / ED Diagnoses   Final diagnoses:  Contusion of left shoulder, initial encounter    ED Discharge Orders        Ordered    cyclobenzaprine (FLEXERIL) 10 MG tablet  2 times daily PRN     11/09/17 1335       Kerrie Buffaloeese, Galen Malkowski LincolnM, NP 11/09/17 1344    Azalia Bilisampos, Kevin, MD 11/09/17 1614

## 2018-09-21 ENCOUNTER — Encounter (HOSPITAL_COMMUNITY): Payer: Self-pay

## 2018-09-21 ENCOUNTER — Other Ambulatory Visit: Payer: Self-pay

## 2018-09-21 ENCOUNTER — Emergency Department (HOSPITAL_COMMUNITY): Payer: Non-veteran care

## 2018-09-21 ENCOUNTER — Emergency Department (HOSPITAL_COMMUNITY)
Admission: EM | Admit: 2018-09-21 | Discharge: 2018-09-21 | Disposition: A | Discharge status: Home / Self Care | Payer: Non-veteran care | Attending: Emergency Medicine | Admitting: Emergency Medicine

## 2018-09-21 DIAGNOSIS — S60211A Contusion of right wrist, initial encounter: Secondary | ICD-10-CM | POA: Insufficient documentation

## 2018-09-21 DIAGNOSIS — Y9301 Activity, walking, marching and hiking: Secondary | ICD-10-CM | POA: Insufficient documentation

## 2018-09-21 DIAGNOSIS — Y999 Unspecified external cause status: Secondary | ICD-10-CM | POA: Diagnosis not present

## 2018-09-21 DIAGNOSIS — S0003XA Contusion of scalp, initial encounter: Secondary | ICD-10-CM | POA: Insufficient documentation

## 2018-09-21 DIAGNOSIS — W19XXXA Unspecified fall, initial encounter: Secondary | ICD-10-CM

## 2018-09-21 DIAGNOSIS — S0990XA Unspecified injury of head, initial encounter: Secondary | ICD-10-CM | POA: Diagnosis present

## 2018-09-21 DIAGNOSIS — F17200 Nicotine dependence, unspecified, uncomplicated: Secondary | ICD-10-CM | POA: Insufficient documentation

## 2018-09-21 DIAGNOSIS — Y92008 Other place in unspecified non-institutional (private) residence as the place of occurrence of the external cause: Secondary | ICD-10-CM | POA: Diagnosis not present

## 2018-09-21 DIAGNOSIS — W109XXA Fall (on) (from) unspecified stairs and steps, initial encounter: Secondary | ICD-10-CM | POA: Diagnosis not present

## 2018-09-21 DIAGNOSIS — Z79899 Other long term (current) drug therapy: Secondary | ICD-10-CM | POA: Diagnosis not present

## 2018-09-21 DIAGNOSIS — S93402A Sprain of unspecified ligament of left ankle, initial encounter: Secondary | ICD-10-CM | POA: Insufficient documentation

## 2018-09-21 MED ORDER — IBUPROFEN 800 MG PO TABS
800.0000 mg | ORAL_TABLET | Freq: Once | ORAL | Status: AC
  Administered 2018-09-21: 800 mg via ORAL
  Filled 2018-09-21: qty 1

## 2018-09-21 NOTE — ED Triage Notes (Addendum)
Patient reports that he fell down some steps,but does not know how many and won't elaborate. Patient c/o left foot and ankle, left and left knee pain, and left head pain.  Patient is very uncooperative. Patient smells of ETOH.
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
## 2018-09-21 NOTE — ED Notes (Signed)
Patient transported to CT 

## 2018-09-21 NOTE — ED Notes (Signed)
Pt ambulated by self. Gait steady

## 2018-09-21 NOTE — ED Notes (Addendum)
Pt verbalized discharge paperwork and follow up care. Family is taking him home. Ambulatory with steady gait.

## 2019-08-20 ENCOUNTER — Emergency Department (HOSPITAL_COMMUNITY): Payer: No Typology Code available for payment source

## 2019-08-20 ENCOUNTER — Inpatient Hospital Stay (HOSPITAL_COMMUNITY)
Admission: EM | Admit: 2019-08-20 | Discharge: 2019-08-21 | DRG: 066 | Disposition: A | Discharge status: Home Health | Payer: No Typology Code available for payment source | Attending: Family Medicine | Admitting: Family Medicine

## 2019-08-20 ENCOUNTER — Encounter (HOSPITAL_COMMUNITY): Payer: Self-pay

## 2019-08-20 ENCOUNTER — Other Ambulatory Visit: Payer: Self-pay

## 2019-08-20 DIAGNOSIS — F17210 Nicotine dependence, cigarettes, uncomplicated: Secondary | ICD-10-CM | POA: Diagnosis present

## 2019-08-20 DIAGNOSIS — Z79891 Long term (current) use of opiate analgesic: Secondary | ICD-10-CM

## 2019-08-20 DIAGNOSIS — Z791 Long term (current) use of non-steroidal anti-inflammatories (NSAID): Secondary | ICD-10-CM

## 2019-08-20 DIAGNOSIS — Z818 Family history of other mental and behavioral disorders: Secondary | ICD-10-CM | POA: Diagnosis not present

## 2019-08-20 DIAGNOSIS — F4310 Post-traumatic stress disorder, unspecified: Secondary | ICD-10-CM | POA: Diagnosis present

## 2019-08-20 DIAGNOSIS — I6389 Other cerebral infarction: Secondary | ICD-10-CM | POA: Diagnosis not present

## 2019-08-20 DIAGNOSIS — R29810 Facial weakness: Secondary | ICD-10-CM | POA: Diagnosis present

## 2019-08-20 DIAGNOSIS — Z20828 Contact with and (suspected) exposure to other viral communicable diseases: Secondary | ICD-10-CM | POA: Diagnosis present

## 2019-08-20 DIAGNOSIS — I6381 Other cerebral infarction due to occlusion or stenosis of small artery: Principal | ICD-10-CM | POA: Diagnosis present

## 2019-08-20 DIAGNOSIS — I639 Cerebral infarction, unspecified: Secondary | ICD-10-CM | POA: Diagnosis present

## 2019-08-20 DIAGNOSIS — Z79899 Other long term (current) drug therapy: Secondary | ICD-10-CM | POA: Diagnosis not present

## 2019-08-20 DIAGNOSIS — R29702 NIHSS score 2: Secondary | ICD-10-CM | POA: Diagnosis present

## 2019-08-20 HISTORY — DX: Post-traumatic stress disorder, unspecified: F43.10

## 2019-08-20 LAB — COMPREHENSIVE METABOLIC PANEL WITH GFR
ALT: 16 U/L (ref 0–44)
AST: 16 U/L (ref 15–41)
Albumin: 4.1 g/dL (ref 3.5–5.0)
Alkaline Phosphatase: 67 U/L (ref 38–126)
Anion gap: 10 (ref 5–15)
BUN: 17 mg/dL (ref 6–20)
CO2: 23 mmol/L (ref 22–32)
Calcium: 9.2 mg/dL (ref 8.9–10.3)
Chloride: 105 mmol/L (ref 98–111)
Creatinine, Ser: 0.85 mg/dL (ref 0.61–1.24)
GFR calc Af Amer: >60 mL/min
GFR calc non Af Amer: >60 mL/min
Glucose, Bld: 102 mg/dL — ABNORMAL HIGH (ref 70–99)
Potassium: 3.5 mmol/L (ref 3.5–5.1)
Sodium: 138 mmol/L (ref 135–145)
Total Bilirubin: 1.2 mg/dL (ref 0.3–1.2)
Total Protein: 7.3 g/dL (ref 6.5–8.1)

## 2019-08-20 LAB — CBC WITH DIFFERENTIAL/PLATELET
Abs Immature Granulocytes: 0.01 10*3/uL (ref 0.00–0.07)
Basophils Absolute: 0 10*3/uL (ref 0.0–0.1)
Basophils Relative: 1 %
Eosinophils Absolute: 0 10*3/uL (ref 0.0–0.5)
Eosinophils Relative: 1 %
HCT: 42.3 % (ref 39.0–52.0)
Hemoglobin: 13.4 g/dL (ref 13.0–17.0)
Immature Granulocytes: 0 %
Lymphocytes Relative: 34 %
Lymphs Abs: 1.5 10*3/uL (ref 0.7–4.0)
MCH: 26.5 pg (ref 26.0–34.0)
MCHC: 31.7 g/dL (ref 30.0–36.0)
MCV: 83.6 fL (ref 80.0–100.0)
Monocytes Absolute: 0.3 10*3/uL (ref 0.1–1.0)
Monocytes Relative: 7 %
Neutro Abs: 2.4 10*3/uL (ref 1.7–7.7)
Neutrophils Relative %: 57 %
Platelets: 200 10*3/uL (ref 150–400)
RBC: 5.06 MIL/uL (ref 4.22–5.81)
RDW: 15 % (ref 11.5–15.5)
WBC: 4.2 10*3/uL (ref 4.0–10.5)
nRBC: 0 % (ref 0.0–0.2)

## 2019-08-20 LAB — CBC
HCT: 40.4 % (ref 39.0–52.0)
Hemoglobin: 12.7 g/dL — ABNORMAL LOW (ref 13.0–17.0)
MCH: 26.2 pg (ref 26.0–34.0)
MCHC: 31.4 g/dL (ref 30.0–36.0)
MCV: 83.5 fL (ref 80.0–100.0)
Platelets: 185 10*3/uL (ref 150–400)
RBC: 4.84 MIL/uL (ref 4.22–5.81)
RDW: 14.7 % (ref 11.5–15.5)
WBC: 4.4 10*3/uL (ref 4.0–10.5)
nRBC: 0 % (ref 0.0–0.2)

## 2019-08-20 LAB — CREATININE, SERUM
Creatinine, Ser: 0.82 mg/dL (ref 0.61–1.24)
GFR calc Af Amer: >60 mL/min (ref >60)
GFR calc non Af Amer: >60 mL/min (ref >60)

## 2019-08-20 LAB — SARS CORONAVIRUS 2 (TAT 6-24 HRS): SARS Coronavirus 2: NEGATIVE

## 2019-08-20 LAB — HIV ANTIBODY (ROUTINE TESTING W REFLEX): HIV Screen 4th Generation wRfx: NONREACTIVE

## 2019-08-20 MED ORDER — SODIUM CHLORIDE 0.9 % IV SOLN
INTRAVENOUS | Status: DC
  Administered 2019-08-20: 09:00:00 via INTRAVENOUS

## 2019-08-20 MED ORDER — SENNOSIDES-DOCUSATE SODIUM 8.6-50 MG PO TABS: 1.0000 | ORAL_TABLET | Freq: Every evening | ORAL | Status: DC | PRN

## 2019-08-20 MED ORDER — ASPIRIN 300 MG RE SUPP: 300.0000 mg | Freq: Every day | RECTAL | Status: DC

## 2019-08-20 MED ORDER — ATORVASTATIN CALCIUM 80 MG PO TABS
80.0000 mg | ORAL_TABLET | Freq: Every day | ORAL | Status: DC
  Administered 2019-08-20: 80 mg via ORAL
  Filled 2019-08-20 (×2): qty 1

## 2019-08-20 MED ORDER — ACETAMINOPHEN 650 MG RE SUPP: 650.0000 mg | RECTAL | Status: DC | PRN

## 2019-08-20 MED ORDER — ASPIRIN 325 MG PO TABS
325.0000 mg | ORAL_TABLET | Freq: Every day | ORAL | Status: DC
  Administered 2019-08-20: 325 mg via ORAL
  Filled 2019-08-20: qty 1

## 2019-08-20 MED ORDER — STROKE: EARLY STAGES OF RECOVERY BOOK
Freq: Once | Status: AC
  Administered 2019-08-20: 18:00:00
  Filled 2019-08-20: qty 1

## 2019-08-20 MED ORDER — ACETAMINOPHEN 160 MG/5ML PO SOLN: 650.0000 mg | ORAL | Status: DC | PRN

## 2019-08-20 MED ORDER — ACETAMINOPHEN 325 MG PO TABS: 650.0000 mg | ORAL_TABLET | ORAL | Status: DC | PRN

## 2019-08-20 MED ORDER — ENOXAPARIN SODIUM 40 MG/0.4ML ~~LOC~~ SOLN
40.0000 mg | SUBCUTANEOUS | Status: DC
  Administered 2019-08-20: 40 mg via SUBCUTANEOUS
  Filled 2019-08-20 (×2): qty 0.4

## 2019-08-20 NOTE — ED Provider Notes (Signed)
[facility] DEPT Provider Note   CSN: [id number] Arrival date & time: 08/20/19  0017     History   Chief Complaint Chief Complaint  Patient presents with  . Numbness    HPI [name] is a 53 y.o. male.     53 year old male presents with 7 days of right-sided weakness.  States that he has paresthesias on the right side of his face, right arm, right leg.  Notes trouble ambulating.  Denies any headache.  Symptoms have been persistent and no prior history of same.  Denies any confusion, visual changes, dysarthria.  No complaints of chest or abdominal discomfort.  No treatment used prior to arrival     Past Medical History:  Diagnosis Date  . PTSD (post-traumatic stress disorder)     There are no active problems to display for this patient.   Past Surgical History:  Procedure Laterality Date  . BACK SURGERY    . DENTAL SURGERY          Home Medications    Prior to Admission medications   Medication Sig Start Date End Date Taking? Authorizing Provider  cyclobenzaprine (FLEXERIL) 10 MG tablet Take 1 tablet (10 mg total) by mouth 2 (two) times daily as needed for muscle spasms. 11/09/17   Ashley Murrain, NP  meloxicam (MOBIC) 15 MG tablet Take 15 mg by mouth daily.    [provider]  oxyCODONE-acetaminophen (PERCOCET/ROXICET) 5-325 MG per tablet Take 1 tablet by mouth every 4 (four) hours as needed for pain. 4/94/49   Delora Fuel, MD    Family History Family History  Problem Relation Age of Onset  . Depression Father     Social History Social History   Tobacco Use  . Smoking status: Current Every Day Smoker    Packs/day: 0.20    Years: 34.00    Pack years: 6.80    Types: Cigarettes  . Smokeless tobacco: Never Used  Substance Use Topics  . Alcohol use: Not Currently    Alcohol/week: 6.0 standard drinks    Types: 6 Cans of beer per week  . Drug use: Not Currently     Allergies   Bee venom and Citrus    Review of Systems Review of Systems  All other systems reviewed and are negative.    Physical Exam Updated Vital Signs BP (!) 157/107 (BP Location: Left Arm)   Pulse 99   Temp 98.3 F (36.8 C) (Oral)   Resp 16   Ht 1.88 m (6\' 2" )   Wt 81.6 kg   SpO2 98%   BMI 23.11 kg/m   Physical Exam Vitals signs and nursing note reviewed.  Constitutional:      General: He is not in acute distress.    Appearance: Normal appearance. He is well-developed. He is not toxic-appearing.  HENT:     Head: Normocephalic and atraumatic.  Eyes:     General: Lids are normal.     Conjunctiva/sclera: Conjunctivae normal.     Pupils: Pupils are equal, round, and reactive to light.  Neck:     Musculoskeletal: Normal range of motion and neck supple.     Thyroid: No thyroid mass.     Trachea: No tracheal deviation.  Cardiovascular:     Rate and Rhythm: Normal rate and regular rhythm.     Heart sounds: Normal heart sounds. No murmur. No gallop.   Pulmonary:     Effort: Pulmonary effort is normal. No respiratory distress.  Breath sounds: Normal breath sounds. No stridor. No decreased breath sounds, wheezing, rhonchi or rales.  Abdominal:     General: Bowel sounds are normal. There is no distension.     Palpations: Abdomen is soft.     Tenderness: There is no abdominal tenderness. There is no rebound.  Musculoskeletal: Normal range of motion.        General: No tenderness.  Skin:    General: Skin is warm and dry.     Findings: No abrasion or rash.  Neurological:     Mental Status: He is alert and oriented to person, place, and time.     GCS: GCS eye subscore is 4. GCS verbal subscore is 5. GCS motor subscore is 6.     Cranial Nerves: No cranial nerve deficit.     Sensory: No sensory deficit.     Motor: Weakness and pronator drift present. No tremor.     Comments: Strength is 3 of 5 in right upper and right lower extremity.  No facial symmetry.  Pronator drift noted on the right.  Psychiatric:         Speech: Speech normal.        Behavior: Behavior normal.      ED Treatments / Results  Labs (all labs ordered are listed, but only abnormal results are displayed) Labs Reviewed  CBC WITH DIFFERENTIAL/PLATELET  COMPREHENSIVE METABOLIC PANEL    EKG EKG Interpretation  Date/Time:  Wednesday August 20 2019 09:07:17 EST Ventricular Rate:  81 PR Interval:    QRS Duration: 88 QT Interval:  383 QTC Calculation: 445 R Axis:   8 Text Interpretation: Sinus rhythm Confirmed by Lorre Nick ([id number]) on 08/20/2019 10:09:05 AM   Radiology No results found.  Procedures Procedures (including critical care time)  Medications Ordered in ED Medications  0.9 %  sodium chloride infusion (has no administration in time range)     Initial Impression / Assessment and Plan / ED Course  I have reviewed the triage vital signs and the nursing notes.  Pertinent labs & imaging results that were available during my care of the patient were reviewed by me and considered in my medical decision making (see chart for details).        Patient had negative head CT which was followed by an MRI of the brain which showed positive left thalamic stroke.  Discussed with Dr. Laurence Slate from neurology who requested patient to be admitted to [facility].  Will consult hospitalist service.  Final Clinical Impressions(s) / ED Diagnoses   Final diagnoses:  None    ED Discharge Orders    None       Lorre Nick, MD 08/20/19 1340

## 2019-08-20 NOTE — ED Notes (Signed)
Waiting for medication to be sent down by pharmacy for administration.

## 2019-08-20 NOTE — H&P (Signed)
History and Physical    [name]:[id number] DOB: 1966-06-21 DOA: 08/20/2019  PCP: Default, Provider, MD  Patient coming from: home  I have personally briefly reviewed patient's old medical records in [facility]  Chief Complaint: Right-sided weakness numbness  HPI: [name] is a 53 y.o. male with medical history significant of PTSD, anxiety who presents with right upper extremity and lower extremity numbness and weakness.  Patient reports he was in his usual state of health up until 1 week prior.  He states he was just doing routine activities when he noticed his right shoulder arm and lower leg became numb and tingly.  He states his right side of his face also felt tingly.  However he had had dental work done about a month prior so thought his facial symptoms were related to that.  He states his right upper extremity just felt numb.  He states when he was walking he felt like he was favoring his right lower extremity and that his foot occasionally will turn inward.  He has never had symptoms like this before.  However he does report he had some shoulder pain on the left side and some tingling.  [facility] and prescribed him gabapentin.  Again he denies any prior TIA or stroke.  He has no history of hypertension, hyperlipidemia, or diabetes.  He he does not know much about his family history but reports he does not believe that anyone had a stroke or heart attack.  He has no history of an arrhythmia.  He states he occasionally gets palpitations but attributes it to panic attacks as he deals with his PTSD.  He denies any recent events of those that he recalls.  He does not have any chest pain.  No nausea vomiting.  He denies any diplopia or headaches.  He denies any problems swallowing, no problems with his speech and he reports he has not had any issues with comprehension.  He has not had any falls but does is  as aforementioned fever the right side.  He has never taken aspirin.  His symptom onset was 1 week prior but he decided to present now due to lack of improvement.  No fevers, chills, shortness of breath, cough.  No urinary symptoms no changes to bowel habits.  No problems with intake and he has been eating well.  Patient lives independently but his son occasionally stays with him.  He states he smokes about half pack per day and has been smoking since his 88s.  He denies any alcohol use or drug use.  He normally ambulates without any equipment.  Review of Systems: As per HPI otherwise 10 point review of systems negative.    Past Medical History:  Diagnosis Date   PTSD (post-traumatic stress disorder)     Past Surgical History:  Procedure Laterality Date   BACK SURGERY     DENTAL SURGERY       reports that he has been smoking cigarettes. He has a 6.80 pack-year smoking history. He has never used smokeless tobacco. He reports previous alcohol use of about 6.0 standard drinks of alcohol per week. He reports previous drug use.  Allergies  Allergen Reactions   Bee Venom Swelling   Citrus Swelling    Family History  Problem Relation Age of Onset   Depression Father      Prior to Admission medications  Medication Sig Start Date End Date Taking? Authorizing Provider  meloxicam (MOBIC) 15 MG tablet Take 15 mg by mouth daily.   Yes [provider]  cyclobenzaprine (FLEXERIL) 10 MG tablet Take 1 tablet (10 mg total) by mouth 2 (two) times daily as needed for muscle spasms. Patient not taking: Reported on 08/20/2019 11/09/17   Janne Napoleon, NP  oxyCODONE-acetaminophen (PERCOCET/ROXICET) 5-325 MG per tablet Take 1 tablet by mouth every 4 (four) hours as needed for pain. Patient not taking: Reported on 08/20/2019 11/04/12   Dione Booze, MD    Physical Exam: Vitals:   08/20/19 0842 08/20/19 1030 08/20/19 1130 08/20/19 1304  BP:  134/89 (!) 138/93 (!) 141/94  Pulse:  70  (!) 58 67  Resp:  16 15 16   Temp:      TempSrc:      SpO2:  100% 100% 100%  Weight: 81.6 kg     Height: 6\' 2"  (1.88 m)       Constitutional: NAD, calm, comfortable Vitals:   08/20/19 0842 08/20/19 1030 08/20/19 1130 08/20/19 1304  BP:  134/89 (!) 138/93 (!) 141/94  Pulse:  70 (!) 58 67  Resp:  16 15 16   Temp:      TempSrc:      SpO2:  100% 100% 100%  Weight: 81.6 kg     Height: 6\' 2"  (1.88 m)      General: Pleasant, no apparent distress, oriented x3 Eyes: PERRL, lids and conjunctivae normal, EOMI ENMT: Mucous membranes are moist. Posterior pharynx clear of any exudate or lesions.Normal dentition.  Neck: normal, supple, no masses, no thyromegaly Respiratory: clear to auscultation bilaterally, no wheezing, no crackles. Normal respiratory effort. No accessory muscle use.  Cardiovascular: Regular rate and rhythm, no murmurs / rubs / gallops. No extremity edema. 2+ pedal pulses. No carotid bruits.  Abdomen: no tenderness, no masses palpated. No hepatosplenomegaly. Bowel sounds positive.  Musculoskeletal: no clubbing / cyanosis. No joint deformity upper and lower extremities. Good ROM, no contractures. Normal muscle tone.  Skin: no rashes, lesions, ulcers. No induration Neurologic: CN 5 with diminished sensation to V2 and V3.  Right upper and lower extremity with diminished sensation.  Strength 5 out of 5 to bilateral upper extremities and bilateral lower extremities.  No pronator drift.  Gait was not assessed at this time Psychiatric: Normal judgment and insight. Alert and oriented x 3. Normal mood.   Labs on Admission: I have personally reviewed following labs and imaging studies  CBC: Recent Labs  Lab 08/20/19 0848  WBC 4.2  NEUTROABS 2.4  HGB 13.4  HCT 42.3  MCV 83.6  PLT 200   Basic Metabolic Panel: Recent Labs  Lab 08/20/19 0848  NA 138  K 3.5  CL 105  CO2 23  GLUCOSE 102*  BUN 17  CREATININE 0.85  CALCIUM 9.2   GFR: Estimated Creatinine Clearance: 113.3  mL/min (by C-G formula based on SCr of 0.85 mg/dL). Liver Function Tests: Recent Labs  Lab 08/20/19 0848  AST 16  ALT 16  ALKPHOS 67  BILITOT 1.2  PROT 7.3  ALBUMIN 4.1   No results for input(s): LIPASE, AMYLASE in the last 168 hours. No results for input(s): AMMONIA in the last 168 hours. Coagulation Profile: No results for input(s): INR, PROTIME in the last 168 hours. Cardiac Enzymes: No results for input(s): CKTOTAL, CKMB, CKMBINDEX, TROPONINI in the last 168 hours. BNP (last 3 results) No results for input(s): PROBNP in the last 8760 hours. HbA1C: No results  for input(s): HGBA1C in the last 72 hours. CBG: No results for input(s): GLUCAP in the last 168 hours. Lipid Profile: No results for input(s): CHOL, HDL, LDLCALC, TRIG, CHOLHDL, LDLDIRECT in the last 72 hours. Thyroid Function Tests: No results for input(s): TSH, T4TOTAL, FREET4, T3FREE, THYROIDAB in the last 72 hours. Anemia Panel: No results for input(s): VITAMINB12, FOLATE, FERRITIN, TIBC, IRON, RETICCTPCT in the last 72 hours. Urine analysis:    Component Value Date/Time   COLORURINE YELLOW 06/22/2011 1408   APPEARANCEUR CLEAR 06/22/2011 1408   LABSPEC 1.004 (L) 06/22/2011 1408   PHURINE 6.5 06/22/2011 1408   GLUCOSEU NEGATIVE 06/22/2011 1408   HGBUR NEGATIVE 06/22/2011 1408   BILIRUBINUR NEGATIVE 06/22/2011 1408   KETONESUR NEGATIVE 06/22/2011 1408   PROTEINUR NEGATIVE 06/22/2011 1408   UROBILINOGEN 0.2 06/22/2011 1408   NITRITE NEGATIVE 06/22/2011 1408   LEUKOCYTESUR NEGATIVE 06/22/2011 1408    Radiological Exams on Admission: Ct Head Wo Contrast  Result Date: 08/20/2019 CLINICAL DATA:  Altered mental status. Left face, arm and leg numbness 1 week. EXAM: CT HEAD WITHOUT CONTRAST TECHNIQUE: Contiguous axial images were obtained from the base of the skull through the vertex without intravenous contrast. COMPARISON:  09/21/2018 FINDINGS: Brain: Ventricles, cisterns and other CSF spaces are normal. There  is no mass, mass effect, shift of midline structures or acute hemorrhage. There are small old lacunar infarcts in the region of the right caudate and left thalamus. No acute infarction. Vascular: No hyperdense vessel or unexpected calcification. Skull: Normal. Negative for fracture or focal lesion. Sinuses/Orbits: No acute finding. Other: None. IMPRESSION: 1.  No acute findings. 2.  Small old bilateral lacunar infarcts over the basal ganglia. Electronically Signed   By: Marin Olp M.D.   On: 08/20/2019 10:21   Mr Brain Wo Contrast (neuro Protocol)  Result Date: 08/20/2019 CLINICAL DATA:  Altered mental status.  Left-sided numbness 1 week EXAM: MRI HEAD WITHOUT CONTRAST TECHNIQUE: Multiplanar, multiecho pulse sequences of the brain and surrounding structures were obtained without intravenous contrast. COMPARISON:  CT head 08/20/2019, 09/21/2018 FINDINGS: Brain: Restricted diffusion left thalamus compatible with acute or subacute infarct. This measures approximately 10 x 15 mm. Adjacent small chronic lacunar infarction in the left thalamus. No other acute infarct Small chronic lacunar infarctions in the right thalamus. Cerebral white matter normal. Negative for hemorrhage mass or hydrocephalus. Vascular: Normal arterial flow voids. Skull and upper cervical spine: Negative Sinuses/Orbits: Paranasal sinuses clear. Left mastoid effusion. Normal orbit. 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 Electronically Signed   By: Franchot Gallo M.D.   On: 08/20/2019 12:36    EKG: Independently reviewed.   Assessment/Plan [name] is a 53 y.o. male with medical history significant of PTSD, anxiety who presents with right upper extremity and lower extremity numbness and weakness as well as facial numbness concerning for CVA with MRI revealing acute to subacute infarct of the left thalamus as well as chronic bilateral thalamic  infarcts.  #Acute to subacute left thalamic infarct -Patient with onset of symptoms approximately 1 week prior.  No prior history of hypertension, hyperlipidemia, diabetes nor prior definitive TIA or CVA history.  He has no known arrhythmia.  Will admit to [facility] for CVA work-up.  However has left shoulder tingling and pain may reflect prior infarct which has been since managed with gabapentin without true improvement. -Continue stroke protocol, will allow permissive hypertension for now though symptoms likely well out of the window at this  time and anticipate sooner resumption of blood pressure control -We will obtain echo and carotid Dopplers for vessel imaging as CT and MRI have already been completed with the latter revealing the CVA -SLP, PT, and OT consults -We will check hemoglobin A1c and lipid profile -We will continue to monitor on telemetry anticipate Zio patch if needed -We will initiate aspirin and statin therapy, will defer addition of second antiplatelet for 3 weeks to expertise of neurology, Dr. Wilford CornerArora  #PTSD -Patient reports he takes quetiapine but is unaware of his dose -[facility] -Would resume once dosing is known  DVT prophylaxis: Lovenox Code Status: Full [facility]position [facility] Cons[facility] called: Neurology, Dr. Wilford CornerArora Admission status: Telemetry   Clydia LlanoAravind Almira Phetteplace MD [facility] Pager [phone]  If 7PM-7AM, please contact night-coverage [email or web address] Password [email or web address]  08/20/2019, 3:53 PM

## 2019-08-20 NOTE — ED Triage Notes (Signed)
Patient c/o left face, left arm, and left leg numbness x 1 week.

## 2019-08-21 ENCOUNTER — Other Ambulatory Visit (HOSPITAL_COMMUNITY): Payer: Worker's Compensation

## 2019-08-21 ENCOUNTER — Inpatient Hospital Stay (HOSPITAL_COMMUNITY): Payer: No Typology Code available for payment source

## 2019-08-21 ENCOUNTER — Encounter (HOSPITAL_COMMUNITY): Payer: Self-pay

## 2019-08-21 DIAGNOSIS — I639 Cerebral infarction, unspecified: Secondary | ICD-10-CM | POA: Diagnosis not present

## 2019-08-21 DIAGNOSIS — I6389 Other cerebral infarction: Secondary | ICD-10-CM | POA: Diagnosis not present

## 2019-08-21 LAB — ECHOCARDIOGRAM COMPLETE
Height: 74 in
Weight: 2880 oz

## 2019-08-21 MED ORDER — ATORVASTATIN CALCIUM 80 MG PO TABS: 80.0000 mg | ORAL_TABLET | Freq: Every day | ORAL | 11 refills | Status: DC

## 2019-08-21 MED ORDER — GABAPENTIN 800 MG PO TABS
800.0000 mg | ORAL_TABLET | Freq: Every day | ORAL | Status: DC
  Filled 2019-08-21: qty 1

## 2019-08-21 MED ORDER — ASPIRIN 81 MG PO TBEC: 81.0000 mg | DELAYED_RELEASE_TABLET | Freq: Every day | ORAL | 11 refills | Status: DC

## 2019-08-21 MED ORDER — SERTRALINE HCL 100 MG PO TABS: 100.0000 mg | ORAL_TABLET | Freq: Every day | ORAL | Status: DC

## 2019-08-21 MED ORDER — SODIUM CHLORIDE (PF) 0.9 % IJ SOLN
INTRAMUSCULAR | Status: AC
  Filled 2019-08-21: qty 50

## 2019-08-21 MED ORDER — QUETIAPINE FUMARATE 50 MG PO TABS
100.0000 mg | ORAL_TABLET | Freq: Once | ORAL | Status: DC
  Filled 2019-08-21: qty 2

## 2019-08-21 MED ORDER — GABAPENTIN 400 MG PO CAPS
800.0000 mg | ORAL_CAPSULE | Freq: Once | ORAL | Status: DC
  Filled 2019-08-21: qty 2

## 2019-08-21 MED ORDER — QUETIAPINE FUMARATE 50 MG PO TABS: 100.0000 mg | ORAL_TABLET | Freq: Every evening | ORAL | Status: DC | PRN

## 2019-08-21 MED ORDER — CLOPIDOGREL BISULFATE 300 MG PO TABS
300.0000 mg | ORAL_TABLET | Freq: Once | ORAL | Status: AC
  Administered 2019-08-21: 300 mg via ORAL
  Filled 2019-08-21: qty 1

## 2019-08-21 MED ORDER — CLOPIDOGREL BISULFATE 75 MG PO TABS: 75.0000 mg | ORAL_TABLET | Freq: Every day | ORAL | Status: DC

## 2019-08-21 MED ORDER — ASPIRIN EC 81 MG PO TBEC
81.0000 mg | DELAYED_RELEASE_TABLET | Freq: Every day | ORAL | Status: DC
  Administered 2019-08-21: 10:00:00 81 mg via ORAL
  Filled 2019-08-21: qty 1

## 2019-08-21 MED ORDER — CLOPIDOGREL BISULFATE 75 MG PO TABS: 75.0000 mg | ORAL_TABLET | Freq: Every day | ORAL | 11 refills | Status: AC

## 2019-08-21 MED ORDER — IOHEXOL 350 MG/ML SOLN
100.0000 mL | Freq: Once | INTRAVENOUS | Status: AC | PRN
  Administered 2019-08-21: 100 mL via INTRAVENOUS

## 2019-08-21 NOTE — Evaluation (Signed)
Clinical/Bedside Swallow Evaluation Patient Details  Name: [name] MRN: [id number] Date of Birth: [birth year]  Today's Date: 08/21/2019 Time: SLP Start Time (ACUTE ONLY): 1500 SLP Stop Time (ACUTE ONLY): 1515 SLP Time Calculation (min) (ACUTE ONLY): 15 min  Past Medical History:  Past Medical History:  Diagnosis Date  . PTSD (post-traumatic stress disorder)    Past Surgical History:  Past Surgical History:  Procedure Laterality Date  . BACK SURGERY    . DENTAL SURGERY     HPI:  53yo male admitted 08/20/2019 with right sided weakness/numbness. PMH: PTSD, anxiety, tobacco abuse. Pt lives independently, son occasionally stays with him. MRI = Acute or subacute infarct left thalamus. Chronic infarcts in the thalamus bilaterally.   Assessment / Plan / Recommendation Clinical Impression  Pt presents with slight right facial numbness per report, but exhibits adequate strength and function. No overt s/s aspiration or obvious oral issues observed or reported. Pt accepted trials of thin liquid, puree, and solid textures without difficulty. No further ST intervention recommended at this time. Please reconsult if needs arise.   SLP Visit Diagnosis: Dysphagia, unspecified (R13.10)    Aspiration Risk  Mild aspiration risk    Diet Recommendation Regular;Thin liquid   Liquid Administration via: Cup;Straw Medication Administration: Whole meds with liquid Supervision: Patient able to self feed Compensations: Slow rate;Small sips/bites Postural Changes: Seated upright at 90 degrees    Other  Recommendations Oral Care Recommendations: Oral care BID   Follow up Recommendations None          Prognosis Prognosis for Safe Diet Advancement: Good      Swallow Study   General Date of Onset: 08/20/19 HPI: 53yo male admitted 08/20/2019 with right sided weakness/numbness. PMH: PTSD, anxiety, tobacco abuse. Pt lives independently, son occasionally stays with him. MRI = Acute or subacute  infarct left thalamus. Chronic infarcts in the thalamus bilaterally. Type of Study: Bedside Swallow Evaluation Previous Swallow Assessment: none Diet Prior to this Study: Regular;Thin liquids Temperature Spikes Noted: No Respiratory Status: Room air History of Recent Intubation: No Behavior/Cognition: Alert;Cooperative;Pleasant mood Oral Cavity Assessment: Within Functional Limits Oral Care Completed by SLP: No Oral Cavity - Dentition: Adequate natural dentition Vision: Functional for self-feeding Self-Feeding Abilities: Able to feed self Patient Positioning: Other (comment)(at EOB) Baseline Vocal Quality: Low vocal intensity Volitional Cough: Strong Volitional Swallow: Able to elicit    Oral/Motor/Sensory Function Overall Oral Motor/Sensory Function: Within functional limits   Ice Chips Ice chips: Not tested   Thin Liquid Thin Liquid: Within functional limits Presentation: Straw    Nectar Thick Nectar Thick Liquid: Not tested   Honey Thick Honey Thick Liquid: Not tested   Puree Puree: Within functional limits Presentation: Self Fed;Spoon   Solid     Solid: Within functional limits Presentation: [place], [place], [place] Pathologist Office: [phone] Pager: [phone]  Shonna Chock 08/21/2019,3:33 PM

## 2019-08-21 NOTE — Discharge Summary (Signed)
[name], is a 53 y.o. male  DOB 11/07/1965  MRN [id number].  Admission date:  08/20/2019  Admitting Physician  Truddie Hidden, MD  Discharge Date:  08/21/2019   Primary MD  Default, Provider, MD  Recommendations for primary care physician for things to follow:   1)STOP meloxicam/mobic since you are taking aspirin and Plavix for stroke prevention  2) you are taking aspirin and Plavix for stroke prevention which are blood thinners so please Avoid ibuprofen/Advil/Aleve/Motrin/Goody Powders/Naproxen/BC powders/Meloxicam/Diclofenac/Indomethacin and other Nonsteroidal anti-inflammatory medications as these will make you more likely to bleed and can cause stomach ulcers, can also cause Kidney problems.   3) smoking cessation strongly advised  4) please take atorvastatin/Lipitor as prescribed for stroke prevention  5) outpatient physical therapy is recommended for you  6) please follow-up with your primary care physician in 1 to 2 weeks for recheck and reevaluation and possible adjustment of your medications  Admission Diagnosis  Cerebrovascular accident (CVA), unspecified mechanism ([place]) [I63.9]   Discharge Diagnosis  Cerebrovascular accident (CVA), unspecified mechanism ([place]) [I63.9]    Active Problems:   Thalamic stroke ([place])      Past Medical History:  Diagnosis Date   PTSD (post-traumatic stress disorder)     Past Surgical History:  Procedure Laterality Date   BACK SURGERY     DENTAL SURGERY         HPI  from the history and physical done on the day of admission:    Chief Complaint: Right-sided weakness numbness  HPI: [name] is a 53 y.o. male with medical history significant of PTSD, anxiety who presents with right upper extremity and lower extremity numbness and weakness.  Patient reports he was in his usual state of health up until 1 week prior.  He states he  was just doing routine activities when he noticed his right shoulder arm and lower leg became numb and tingly.  He states his right side of his face also felt tingly.  However he had had dental work done about a month prior so thought his facial symptoms were related to that.  He states his right upper extremity just felt numb.  He states when he was walking he felt like he was favoring his right lower extremity and that his foot occasionally will turn inward.  He has never had symptoms like this before.  However he does report he had some shoulder pain on the left side and some tingling.  He had followed up with the [facility] for this and they put his shoulder in a sling and prescribed him gabapentin.  Again he denies any prior TIA or stroke.  He has no history of hypertension, hyperlipidemia, or diabetes.  He he does not know much about his family history but reports he does not believe that anyone had a stroke or heart attack.  He has no history of an arrhythmia.  He states he occasionally gets palpitations but attributes it to panic attacks as he deals with his PTSD.  He denies any  recent events of those that he recalls.  He does not have any chest pain.  No nausea vomiting.  He denies any diplopia or headaches.  He denies any problems swallowing, no problems with his speech and he reports he has not had any issues with comprehension.  He has not had any falls but does is as aforementioned fever the right side.  He has never taken aspirin.  His symptom onset was 1 week prior but he decided to present now due to lack of improvement.  No fevers, chills, shortness of breath, cough.  No urinary symptoms no changes to bowel habits.  No problems with intake and he has been eating well.  Patient lives independently but his son occasionally stays with him.  He states he smokes about half pack per day and has been smoking since his 23s.  He denies any alcohol use or drug use.  He normally ambulates without any  equipment.   Hospital Course:     1) left thalamic infarct-patient presented with paresthesias of right arm and face from thalamic stroke as well as right facial droop -Neurology consult appreciated -Stroke work-up included echocardiogram which showed EF of 60 to 65% without regional wall motion normalities -CTA neck and CTA head, as well as MRI brain and CT head without contrast--- demonstrate evolving acute versus subacute left thalamic infarct -Patient strongly advised -Treat with aspirin 81 mg and Plavix 35 mg for the next 3 weeks, follow-up with outpatient provider at [facility] hospital for TSH, hemoglobin A1c and fasting lipid profile -Take Lipitor 80 mg every evening (Even if his lipid panel is within desired limits, patient should still take Lipitor/Statin for it's Pleiotropic effects (beyond cholesterol lowering benefits)   2) anxiety/PTSD--- continue gabapentin, and Zoloft and Seroquel -Patient should talk to PCP at [facility] hospital to see if his Wellbutrin can be weaned down--   Discharge Condition: stable--  Follow UP  Follow-up Information    [facility]   Specialty: Rehabilitation Why: The outpatient therapy will contact you for the first appointment Contact information: [address] [id number] [place] [address] [phone]           Consults obtained - Neurology  Diet and Activity recommendation:  As advised  Discharge Instructions    Discharge Instructions    Ambulatory referral to Occupational Therapy   Complete by: As directed    Ambulatory referral to Physical Therapy   Complete by: As directed    Call MD for:  difficulty breathing, headache or visual disturbances   Complete by: As directed    Call MD for:  persistant dizziness or light-headedness   Complete by: As directed    Call MD for:  persistant nausea and vomiting   Complete by: As directed    Call MD for:   temperature >100.4   Complete by: As directed    Diet - low sodium heart healthy   Complete by: As directed    Discharge instructions   Complete by: As directed    1)STOP meloxicam/mobic since you are taking aspirin and Plavix for stroke prevention  2) you are taking aspirin and Plavix for stroke prevention which are blood thinners so please Avoid ibuprofen/Advil/Aleve/Motrin/Goody Powders/Naproxen/BC powders/Meloxicam/Diclofenac/Indomethacin and other Nonsteroidal anti-inflammatory medications as these will make you more likely to bleed and can cause stomach ulcers, can also cause Kidney problems.   3) smoking cessation strongly advised  4) please take atorvastatin/Lipitor as prescribed for stroke prevention  5)  outpatient physical therapy is recommended for you  6) please follow-up with your primary care physician in 1 to 2 weeks for recheck and reevaluation and possible adjustment of your medications   Increase activity slowly   Complete by: As directed         Discharge Medications     Allergies as of 08/21/2019      Reactions   Bee Venom Swelling   Citrus Swelling      Medication List    STOP taking these medications   meloxicam 15 MG tablet Commonly known as: MOBIC     TAKE these medications   aspirin 81 MG EC tablet Take 1 tablet (81 mg total) by mouth daily with breakfast.   atorvastatin 80 MG tablet Commonly known as: LIPITOR Take 1 tablet (80 mg total) by mouth daily at 6 PM.   buPROPion 300 MG 24 hr tablet Commonly known as: WELLBUTRIN XL Take 300 mg by mouth daily after breakfast.   clopidogrel 75 MG tablet Commonly known as: PLAVIX Take 1 tablet (75 mg total) by mouth daily. Start taking on: August 22, 2019   gabapentin 800 MG tablet Commonly known as: NEURONTIN Take 800 mg by mouth at bedtime.   QUEtiapine 100 MG tablet Commonly known as: SEROQUEL Take 100 mg by mouth at bedtime as needed (sleep and anxiety).   sertraline 100 MG  tablet Commonly known as: ZOLOFT Take 100 mg by mouth daily after breakfast.       Major procedures and Radiology Reports - PLEASE review detailed and final reports for all details, in brief -    Ct Angio Head W Or Wo Contrast  Result Date: 08/21/2019 CLINICAL DATA:  Right facial and extremity numbness; thalamic infarct on MRI EXAM: CT ANGIOGRAPHY HEAD AND NECK TECHNIQUE: Multidetector CT imaging of the head and neck was performed using the standard protocol during bolus administration of intravenous contrast. Multiplanar CT image reconstructions and MIPs were obtained to evaluate the vascular anatomy. Carotid stenosis measurements (when applicable) are obtained utilizing NASCET criteria, using the distal internal carotid diameter as the denominator. CONTRAST:  OMNIPAQUE IOHEXOL 350 MG/ML SOLN COMPARISON:  08/20/2019 FINDINGS: CT HEAD FINDINGS Brain: There is no acute intracranial hemorrhage or mass effect. Area of hypoattenuation is again identified within the left thalamus reflecting evolving recent infarction. Chronic small vessel infarct of the right thalamus is again seen. There is no new loss of gray-white differentiation. Ventricles are stable in size. Vascular: Unremarkable. Skull: Unremarkable. Sinuses: Aerated. Orbits: Unremarkable. Review of the MIP images confirms the above findings CTA NECK FINDINGS Aortic arch: Great vessel origins are patent. Right carotid system: Common, internal, and external carotid arteries are patent. There is no measurable stenosis at the ICA origin. No evidence of dissection Left carotid system: Common, internal, and external carotid arteries are patent. There is no measurable stenosis at the ICA origin. No evidence of dissection. Vertebral arteries: Patent. The right vertebral artery is dominant. No stenosis or evidence of dissection. Skeleton: Mild degenerative changes of the cervical spine. Other neck: Lobular soft tissue extending into the valleculae  likely reflects prominent lingual tonsils. No neck mass or adenopathy. Upper chest: No apical lung mass. Review of the MIP images confirms the above findings CTA HEAD FINDINGS Anterior circulation: Intracranial internal carotid arteries are patent with minimal calcified plaque. Middle and anterior cerebral arteries are patent. Posterior circulation: Intracranial vertebral, basilar, and posterior cerebral arteries are patent. Venous sinuses: Patent as permitted by contrast timing. Anatomic variants: None significant.  Review of the MIP images confirms the above findings IMPRESSION: No proximal intracranial vessel occlusion. No hemodynamically significant stenosis in the neck. Evolving recent left thalamic infarct. No acute intracranial hemorrhage or new loss of gray-white differentiation. Electronically Signed   By: Guadlupe Spanish M.D.   On: 08/21/2019 08:50   Ct Head Wo Contrast  Result Date: 08/20/2019 CLINICAL DATA:  Altered mental status. Left face, arm and leg numbness 1 week. EXAM: CT HEAD WITHOUT CONTRAST TECHNIQUE: Contiguous axial images were obtained from the base of the skull through the vertex without intravenous contrast. COMPARISON:  09/21/2018 FINDINGS: Brain: Ventricles, cisterns and other CSF spaces are normal. There is no mass, mass effect, shift of midline structures or acute hemorrhage. There are small old lacunar infarcts in the region of the right caudate and left thalamus. No acute infarction. Vascular: No hyperdense vessel or unexpected calcification. Skull: Normal. Negative for fracture or focal lesion. Sinuses/Orbits: No acute finding. Other: None. IMPRESSION: 1.  No acute findings. 2.  Small old bilateral lacunar infarcts over the basal ganglia. Electronically Signed   By: Elberta Fortis M.D.   On: 08/20/2019 10:21   Ct Angio Neck W Or Wo Contrast  Result Date: 08/21/2019 CLINICAL DATA:  Right facial and extremity numbness; thalamic infarct on MRI EXAM: CT ANGIOGRAPHY HEAD AND NECK  TECHNIQUE: Multidetector CT imaging of the head and neck was performed using the standard protocol during bolus administration of intravenous contrast. Multiplanar CT image reconstructions and MIPs were obtained to evaluate the vascular anatomy. Carotid stenosis measurements (when applicable) are obtained utilizing NASCET criteria, using the distal internal carotid diameter as the denominator. CONTRAST:  OMNIPAQUE IOHEXOL 350 MG/ML SOLN COMPARISON:  08/20/2019 FINDINGS: CT HEAD FINDINGS Brain: There is no acute intracranial hemorrhage or mass effect. Area of hypoattenuation is again identified within the left thalamus reflecting evolving recent infarction. Chronic small vessel infarct of the right thalamus is again seen. There is no new loss of gray-white differentiation. Ventricles are stable in size. Vascular: Unremarkable. Skull: Unremarkable. Sinuses: Aerated. Orbits: Unremarkable. Review of the MIP images confirms the above findings CTA NECK FINDINGS Aortic arch: Great vessel origins are patent. Right carotid system: Common, internal, and external carotid arteries are patent. There is no measurable stenosis at the ICA origin. No evidence of dissection Left carotid system: Common, internal, and external carotid arteries are patent. There is no measurable stenosis at the ICA origin. No evidence of dissection. Vertebral arteries: Patent. The right vertebral artery is dominant. No stenosis or evidence of dissection. Skeleton: Mild degenerative changes of the cervical spine. Other neck: Lobular soft tissue extending into the valleculae likely reflects prominent lingual tonsils. No neck mass or adenopathy. Upper chest: No apical lung mass. Review of the MIP images confirms the above findings CTA HEAD FINDINGS Anterior circulation: Intracranial internal carotid arteries are patent with minimal calcified plaque. Middle and anterior cerebral arteries are patent. Posterior circulation: Intracranial vertebral,  basilar, and posterior cerebral arteries are patent. Venous sinuses: Patent as permitted by contrast timing. Anatomic variants: None significant. Review of the MIP images confirms the above findings IMPRESSION: No proximal intracranial vessel occlusion. No hemodynamically significant stenosis in the neck. Evolving recent left thalamic infarct. No acute intracranial hemorrhage or new loss of gray-white differentiation. Electronically Signed   By: Guadlupe Spanish M.D.   On: 08/21/2019 08:50   Mr Brain Wo Contrast (neuro Protocol)  Result Date: 08/20/2019 CLINICAL DATA:  Altered mental status.  Left-sided numbness 1 week EXAM: MRI HEAD WITHOUT CONTRAST  TECHNIQUE: Multiplanar, multiecho pulse sequences of the brain and surrounding structures were obtained without intravenous contrast. COMPARISON:  CT head 08/20/2019, 09/21/2018 FINDINGS: Brain: Restricted diffusion left thalamus compatible with acute or subacute infarct. This measures approximately 10 x 15 mm. Adjacent small chronic lacunar infarction in the left thalamus. No other acute infarct Small chronic lacunar infarctions in the right thalamus. Cerebral white matter normal. Negative for hemorrhage mass or hydrocephalus. Vascular: Normal arterial flow voids. Skull and upper cervical spine: Negative Sinuses/Orbits: Paranasal sinuses clear. Left mastoid effusion. Normal orbit. 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 Electronically Signed   By: Marlan Palau M.D.   On: 08/20/2019 12:36    Micro Results    Recent Results (from the past 240 hour(s))  SARS CORONAVIRUS 2 (TAT 6-24 HRS) Nasopharyngeal Nasopharyngeal Swab     Status: None   Collection Time: 08/20/19 12:54 PM   Specimen: Nasopharyngeal Swab  Result Value Ref Range Status   SARS Coronavirus 2 NEGATIVE NEGATIVE Final    Comment: (NOTE) SARS-CoV-2 target nucleic acids are NOT DETECTED. The SARS-CoV-2 RNA is generally  detectable in upper and lower respiratory specimens during the acute phase of infection. Negative results do not preclude SARS-CoV-2 infection, do not rule out co-infections with other pathogens, and should not be used as the sole basis for treatment or other patient management decisions. Negative results must be combined with clinical observations, patient history, and epidemiological information. The expected result is Negative. Fact Sheet for Patients: HairSlick.no Fact Sheet for Healthcare Providers: quierodirigir.com This test is not yet approved or cleared by the [place] FDA and  has been authorized for detection and/or diagnosis of SARS-CoV-2 by FDA under an Emergency Use Authorization (EUA). This EUA will remain  in effect (meaning this test can be used) for the duration of the COVID-19 declaration under Section 56 4(b)(1) of the Act, 21 U.S.C. section 360bbb-3(b)(1), unless the authorization is terminated or revoked sooner. Performed at [facility], [address] [place], [place] [address]        Today   Subjective    [name] today has no new complaints, -Right-sided numbness and tingling improved but not resolved         Patient has been seen and examined prior to discharge   Objective   Blood pressure (!) 145/99, pulse 72, temperature 98.2 F (36.8 C), temperature source Oral, resp. rate 17, height  (1.88 m), weight 81.6 kg, SpO2 98 %.   Intake/Output Summary (Last 24 hours) at 08/21/2019 1906 Last data filed at 08/21/2019 0523 Gross per 24 hour  Intake 1000 ml  Output --  Net 1000 ml    Exam Gen:- Awake Alert, no acute distress  HEENT:- [place].AT, No sclera icterus Neck-Supple Neck,No JVD,.  Lungs-  CTAB , good air movement bilaterally  CV- S1, S2 normal, regular Abd-  +ve B.Sounds, Abd Soft, No tenderness,    Extremity/Skin:- No  edema,   good pulses Psych-affect is  appropriate, oriented x3 Neuro-paresthesias of right arm and face from thalamic stroke as well as right facial droop   Data Review   CBC w Diff:  Lab Results  Component Value Date   WBC 4.4 08/20/2019   HGB 12.7 (L) 08/20/2019   HCT 40.4 08/20/2019   PLT 185 08/20/2019   LYMPHOPCT 34 08/20/2019   MONOPCT 7 08/20/2019   EOSPCT 1 08/20/2019   BASOPCT 1 08/20/2019    CMP:  Lab  Results  Component Value Date   NA 138 08/20/2019   K 3.5 08/20/2019   CL 105 08/20/2019   CO2 23 08/20/2019   BUN 17 08/20/2019   CREATININE 0.82 08/20/2019   PROT 7.3 08/20/2019   ALBUMIN 4.1 08/20/2019   BILITOT 1.2 08/20/2019   ALKPHOS 67 08/20/2019   AST 16 08/20/2019   ALT 16 08/20/2019  .   Total Discharge time is about 33 minutes  Shon Haleourage Jeriko Kowalke M.D on 08/21/2019 at 7:06 PM  Go to [email or web address] -  for contact info  [facility] - Office  [phone]

## 2019-08-21 NOTE — Discharge Instructions (Signed)
1)STOP meloxicam/mobic since you are taking aspirin and Plavix for stroke prevention  2) you are taking aspirin and Plavix for stroke prevention which are blood thinners so please Avoid ibuprofen/Advil/Aleve/Motrin/Goody Powders/Naproxen/BC powders/Meloxicam/Diclofenac/Indomethacin and other Nonsteroidal anti-inflammatory medications as these will make you more likely to bleed and can cause stomach ulcers, can also cause Kidney problems.   3) smoking cessation strongly advised  4) please take atorvastatin/Lipitor as prescribed for stroke prevention  5) outpatient physical therapy is recommended for you  6) please follow-up with your primary care physician in 1 to 2 weeks for recheck and reevaluation and possible adjustment of your medications

## 2019-08-21 NOTE — Evaluation (Addendum)
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  PMHx: PTSD, anxiety.  Clinical Impression  Pt admitted with above. Pt presents with right proximal hip strength deficits, decreased functional use of RUE (also impacted by old rotator cuff injury), right facial and hand numbness, and gait abnormalities. Pt ambulating hallway distances without an assistive device or physical assist. Focused on gait training to promote right heel strike and decreased circumduction. Pt responding well and able to correct. Negotiated 10 steps to prepare for discharge home. Reviewed and performed standing exercises for hip strengthening. Education provided for smoking cessation and BEFAST stroke symptoms. Will benefit from follow up OPPT to promote neuro recovery.     Follow Up Recommendations Outpatient PT    Equipment Recommendations  None recommended by PT    Recommendations for Other Services       Precautions / Restrictions Precautions Precautions: None Restrictions Weight Bearing Restrictions: No      Mobility  Bed Mobility Overal bed mobility: Independent                Transfers Overall transfer level: Independent Equipment used: None                Ambulation/Gait Ambulation/Gait assistance: Modified independent (Device/Increase time) Gait Distance (Feet): 250 Feet Assistive device: None Gait Pattern/deviations: Step-through pattern;Decreased step length - left;Decreased dorsiflexion - right     General Gait Details: Initially, pt with RLE circumduction and decreased heel strike. Able to correct significantly with cues for "heel-toe," upright posture, reciprocal arm swing, shorter right step length.   Stairs Stairs: Yes Stairs assistance: Modified independent (Device/Increase  time) Stair Management: One rail Right Number of Stairs: 10 General stair comments: Cues for sequencing  Wheelchair Mobility    Modified Rankin (Stroke Patients Only) Modified Rankin (Stroke Patients Only) Pre-Morbid Rankin Score: No symptoms Modified Rankin: Moderate disability     Balance Overall balance assessment: Modified Independent                                           Pertinent Vitals/Pain Pain Assessment: Faces Pain Score: 0-No pain Faces Pain Scale: No hurt Pain Location: R shoulder - no pain when still. Sharp pain with some movements. 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    Lower Extremity Assessment Lower Extremity Assessment: RLE deficits/detail;LLE deficits/detail RLE Deficits / Details: Strength: 3+/5 hip flexion, 5/5  knee extension, 5/5 ankle dorsiflexion LLE Deficits / Details: Strength 5/5    Cervical / Trunk Assessment Cervical / Trunk Assessment: Normal  Communication   Communication: No difficulties   Cognition Arousal/Alertness: Awake/alert Behavior During Therapy: WFL for tasks assessed/performed Overall Cognitive Status: Within Functional Limits for tasks assessed                                        General Comments General comments (skin integrity, edema, etc.): Pt education on "BEFAST" for signs of CVA. pt able to name 4/6.    Exercises Other Exercises Other Exercises: Standing hip flexion, abduction and extension x 15   Assessment/Plan    PT Assessment Patient needs continued PT services  PT Problem List Decreased strength;Decreased balance;Decreased mobility;Impaired sensation       PT Treatment Interventions Gait training;Stair training;Functional mobility training;Therapeutic activities;Therapeutic exercise;Balance training;Patient/family education    PT Goals (Current goals can be found in the Care Plan section)  Acute Rehab PT Goals Patient Stated Goal: "to stop smoking." PT Goal Formulation: With patient Time For Goal Achievement: 09/04/19 Potential to Achieve Goals: Good    Frequency Min 4X/week   Barriers to discharge        Co-evaluation               AM-PAC PT "6 Clicks" Mobility  Outcome Measure Help needed turning from your back to your side while in a flat bed without using bedrails?: None Help needed moving from lying on your back to sitting on the side of a flat bed without using bedrails?: None Help needed moving to and from a bed to a chair (including a wheelchair)?: None Help needed standing up from a chair using your arms (e.g., wheelchair or bedside chair)?: None Help needed to walk in hospital room?: None Help needed climbing 3-5 steps with a railing? : None 6 Click Score: 24    End of Session   Activity Tolerance: Patient tolerated treatment well Patient left: in bed;with call bell/phone within reach Nurse Communication: Mobility status PT Visit Diagnosis: Other abnormalities of gait and mobility  (R26.89);Difficulty in walking, not elsewhere classified (R26.2)    Time: 7654-6503 PT Time Calculation (min) (ACUTE ONLY): 24 min   Charges:   PT Evaluation $PT Eval Low Complexity: 1 Low PT Treatments $Gait Training: 8-22 mins        Ellamae Sia, PT, DPT Acute Rehabilitation Services Pager [phone] Office [phone]   Willy Eddy 08/21/2019, 3:11 PM

## 2019-08-21 NOTE — Evaluation (Signed)
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  PMHx: PTSD, anxiety.   Clinical Impression   Pt PTA: Independent and living alone; son sometimes stays over. Pt currently modified independence with ADL and mobility. Pt showering and completing ADL routine with no physical assist. Pt's RUE continues to have deficits in shoulder ROM (prior injury), sensation in palm upward to elbow and decreased FMC in hand. Pt's handwriting is legible in R hand and pt able to send a text with increased time in R hand.  Education provided for BEFAST and necessity of RUE to be used at all times to increase independence. Pt would benefit from continued OT skilled services in OP OT setting for fine motor coordination and sensation changes. OT signing off at this time.      Follow Up Recommendations  Outpatient OT(neuro if able)    Equipment Recommendations  None recommended by OT    Recommendations for Other Services       Precautions / Restrictions Precautions Precautions: None Restrictions Weight Bearing Restrictions: No      Mobility Bed Mobility Overal bed mobility: Modified Independent                Transfers Overall transfer level: Modified independent                    Balance Overall balance assessment: Modified Independent                                         ADL either performed or assessed with clinical judgement   ADL Overall ADL's : Modified independent;At baseline                                       General ADL Comments: Pt with decreased sensation in R palm and decreased FMC in RUE. Pt able to shower in room with modified indepdendent. Pt dressing and performing ADL routine with no physical assist.     Vision Baseline  Vision/History: No visual deficits Vision Assessment?: No apparent visual deficits     Perception     Praxis      Pertinent Vitals/Pain Pain Assessment: 0-10 Pain Score: 4  Pain Location: R shoulder Pain Descriptors / Indicators: Grimacing;Discomfort Pain Intervention(s): Monitored during session     Hand Dominance     Extremity/Trunk Assessment Upper Extremity Assessment Upper Extremity Assessment: RUE deficits/detail;LUE deficits/detail RUE Deficits / Details: AROM 30-35* shoulder flex (old rotator cuff injury) RUE Coordination: decreased fine motor LUE Deficits / Details: AROM, WFLs- but reports both shoulders have pain.   Lower Extremity Assessment Lower Extremity Assessment: Overall WFL for tasks assessed   Cervical / Trunk Assessment Cervical / Trunk Assessment: Normal   Communication Communication Communication: No difficulties   Cognition Arousal/Alertness: Awake/alert Behavior During Therapy: WFL for tasks assessed/performed Overall Cognitive Status: Within Functional Limits for tasks assessed                                     General Comments  Pt education on "BEFAST" for signs  of CVA. pt able to name 4/6.    Exercises     Shoulder Instructions      Home Living Family/patient expects to be discharged to:: Private residence Living Arrangements: Alone Available Help at Discharge: Family;Available PRN/intermittently                                    Prior Functioning/Environment Level of Independence: Independent                 OT Problem List: Decreased strength;Decreased activity tolerance;Decreased coordination      OT Treatment/Interventions:      OT Goals(Current goals can be found in the care plan section) Acute Rehab OT Goals Patient Stated Goal: to go home OT Goal Formulation: With patient  OT Frequency:     Barriers to D/C:            Co-evaluation              AM-PAC OT "6 Clicks"  Daily Activity     Outcome Measure Help from another person eating meals?: None Help from another person taking care of personal grooming?: None Help from another person toileting, which includes using toliet, bedpan, or urinal?: None Help from another person bathing (including washing, rinsing, drying)?: None Help from another person to put on and taking off regular upper body clothing?: None Help from another person to put on and taking off regular lower body clothing?: None 6 Click Score: 24   End of Session Nurse Communication: Mobility status  Activity Tolerance: Patient tolerated treatment well Patient left: in bed;with call bell/phone within reach  OT Visit Diagnosis: Unsteadiness on feet (R26.81);Muscle weakness (generalized) (M62.81)                Time: 0962-8366 OT Time Calculation (min): 26 min Charges:  OT General Charges $OT Visit: 1 Visit OT Evaluation $OT Eval Moderate Complexity: 1 Mod OT Treatments $Self Care/Home Management : 8-22 mins  Ebony Hail Harold Hedge) Marsa Aris OTR/L Acute Rehabilitation Services Pager: [phone] Office: [place] 08/21/2019, 2:56 PM

## 2019-08-21 NOTE — ED Notes (Signed)
[facility] Called for patient transport.

## 2019-08-21 NOTE — Progress Notes (Signed)
  Echocardiogram 2D Echocardiogram has been performed.  Jannett Celestine 08/21/2019, 3:59 PM

## 2019-08-21 NOTE — Evaluation (Signed)
SLP Cancellation Note  Patient Details Name: [name] MRN: [id number] DOB: 1966/01/01   Cancelled treatment:       Reason Eval/Treat Not Completed: Patient at procedure or test/unavailable(pt was transferred to [facility] and this SLP at [facility] at this time, will continue efforts)   Macario Golds 08/21/2019, 12:23 PM   Luanna Salk, [place] SLP [place] Pager [phone] Office [phone]

## 2019-08-21 NOTE — Consult Note (Signed)
Neurology Consultation Reason for Consult: Stroke Referring Physician: Freida Busman, a  CC: Right-sided numbness  History is obtained from: Patient  HPI: GREGOREY NABOR is a 53 y.o. male with history of tobacco abuse who presents with right-sided numbness and weakness that started about a week ago.  He noticed numbness and some weakness.  He states that he started noticing his foot was kind of turning in and that he was not getting better and therefore presented to the emergency department for further evaluation.  In the ER, an MRI was obtained which demonstrates thalamic infarct on the left.  He denies any other antecedent illness.   LKW: 1 week ago tpa given?: no, outside of window    ROS: A 14 point ROS was performed and is negative except as noted in the HPI.  Past Medical History:  Diagnosis Date  . PTSD (post-traumatic stress disorder)      Family History  Problem Relation Age of Onset  . Depression Father      Social History:  reports that he has been smoking cigarettes. He has a 6.80 pack-year smoking history. He has never used smokeless tobacco. He reports previous alcohol use of about 6.0 standard drinks of alcohol per week. He reports previous drug use.   Exam: Current vital signs: BP 130/87 (BP Location: Left Arm)   Pulse 60   Temp 98.3 F (36.8 C) (Oral)   Resp 14   Ht 6\' 2"  (1.88 m)   Wt 81.6 kg   SpO2 97%   BMI 23.11 kg/m  Vital signs in last 24 hours: Temp:  [98.3 F (36.8 C)] 98.3 F (36.8 C) (11/11 0840) Pulse Rate:  [55-100] 60 (11/12 0630) Resp:  [13-30] 14 (11/12 0630) BP: (120-159)/(77-107) 130/87 (11/12 0630) SpO2:  [96 %-100 %] 97 % (11/12 0630) Weight:  [81.6 kg] 81.6 kg (11/11 0842)   Physical Exam  Constitutional: Appears well-developed and well-nourished.  Psych: Affect appropriate to situation Eyes: No scleral injection HENT: No OP obstrucion MSK: no joint deformities.  Cardiovascular: Normal rate and regular rhythm.   Respiratory: Effort normal, non-labored breathing GI: Soft.  No distension. There is no tenderness.  Skin: WDI  Neuro: Mental Status: Patient is awake, alert, oriented to person, place, month, year, and situation. Patient is able to give a clear and coherent history. No signs of aphasia or neglect Cranial Nerves: II: Visual Fields are full. Pupils are equal, round, and reactive to light.   III,IV, VI: EOMI without ptosis or diploplia.  V: Facial sensation is diminished on the right VII: Facial movement is symmetric.  VIII: hearing is intact to voice X: Uvula elevates symmetrically XI: Shoulder shrug is symmetric. XII: tongue is midline without atrophy or fasciculations.  Motor: Tone is normal. Bulk is normal. 4+/5 strength was present in the right arm and leg, 5/5 in the left Sensory: Sensation is diminished on the right Cerebellar: FNF and HKS are consistent with weakness on the right, intact on left   I have reviewed labs in epic and the results pertinent to this consultation are: CMP, CBC-unremarkable  I have reviewed the images obtained: MRI brain-acute infarct in the left thalamus  Impression: 53 year old male with subacute infarct likely due to small vessel disease.  He has been started on antiplatelet therapy with aspirin as well as Lipitor.  Recommendations: - HgbA1c, fasting lipid panel - CTA head and neck.  - Frequent neuro checks - Echocardiogram - Prophylactic therapy-Antiplatelet med: asa 81mg  and plavix 75mg  daily - Risk  factor modification - Telemetry monitoring - PT consult, OT consult, Speech consult - If workup is completed without significant findings, could consider discharge without transfer to [facility].    Roland Rack, MD [facility] [phone]  If 7pm- 7am, please page neurology on call as listed in [place].

## 2019-08-21 NOTE — Progress Notes (Signed)
Patient arrived to unit

## 2019-08-21 NOTE — TOC Transition Note (Signed)
Transition of Care [facility]) - CM/SW Discharge Note   Patient Details  Name: DASANI CREAR MRN: [id number] Date of Birth: 11-29-1965  Transition of Care [facility] CM/SW Contact:  Pollie Friar, RN Phone Number: 08/21/2019, 3:17 PM   Clinical Narrative:    Pt discharging home with outpatient therapy. CM met with the patient and he is interested in attending [facility]. Orders in Epic and information on the AVS. Pt has transportation home.    Final next level of care: OP Rehab Barriers to Discharge: No Barriers Identified   Patient Goals and CMS Choice     Choice offered to / list presented to : Patient  Discharge Placement                       Discharge Plan and Services                                     Social Determinants of Health (SDOH) Interventions     Readmission Risk Interventions No flowsheet data found.

## 2019-08-21 NOTE — Progress Notes (Signed)
Patient transferred from [facility] to [facility] prior to my evaluation. I discussed the case with Dr. Denton Brick who will be the patient's hospitalist at [facility].   Marva Panda, DO Pager: [phone]

## 2019-08-21 NOTE — ED Notes (Signed)
Patient transported to [facility] 

## 2019-08-21 NOTE — Evaluation (Signed)
Speech Language Pathology Evaluation Patient Details Name: [name] MRN: [id number] DOB: 1966/04/04 Today's Date: 08/21/2019 Time: 1445-1500 SLP Time Calculation (min) (ACUTE ONLY): 15 min  Problem List:  Patient Active Problem List   Diagnosis Date Noted  . Thalamic stroke ([place]) 08/20/2019   Past Medical History:  Past Medical History:  Diagnosis Date  . PTSD (post-traumatic stress disorder)    Past Surgical History:  Past Surgical History:  Procedure Laterality Date  . BACK SURGERY    . DENTAL SURGERY     HPI:  53yo male admitted 08/20/2019 with right sided weakness/numbness. PMH: PTSD, anxiety, tobacco abuse. Pt lives independently, son occasionally stays with him. MRI = Acute or subacute infarct left thalamus. Chronic infarcts in the thalamus bilaterally.   Assessment / Plan / Recommendation Clinical Impression  Pt presents with baseline speech, language, and cognition. No significant difficulty with attention, recall, problem solving observed or reported. Pt reports slight numbness in right side of the face, however, speech is fully intelligible. No further ST intervention recommended at this time. Pt was encouraged to notify PCP if difficulties arise upon return to normal routines.    SLP Assessment  SLP Recommendation/Assessment: Patient does not need any further Speech Language Pathology Services SLP Visit Diagnosis: Cognitive communication deficit (R41.841)    Follow Up Recommendations  None(outpatient or home health ST if needs arise.)       SLP Evaluation Cognition  Overall Cognitive Status: Within Functional Limits for tasks assessed Orientation Level: Oriented X4;Oriented to place;Oriented to situation;Oriented to person;Oriented to time Attention: Focused;Sustained Focused Attention: Appears intact Sustained Attention: Appears intact Memory: Appears intact Awareness: Appears intact Problem Solving: Appears intact Executive Function:  Reasoning Reasoning: Appears intact Safety/Judgment: Appears intact       Comprehension  Auditory Comprehension Overall Auditory Comprehension: Appears within functional limits for tasks assessed    Expression Expression Primary Mode of Expression: Verbal Verbal Expression Overall Verbal Expression: Appears within functional limits for tasks assessed Written Expression Dominant Hand: Right   Oral / Motor  Oral Motor/Sensory Function Overall Oral Motor/Sensory Function: Within functional limits Motor Speech Overall Motor Speech: Appears within functional limits for tasks assessed   GO                   Enriqueta Shutter, [place], CCC-SLP Speech Language Pathologist Office: [phone] Pager: [phone]  Shonna Chock 08/21/2019, 3:22 PM

## 2019-08-21 NOTE — Plan of Care (Signed)
discharge

## 2019-08-22 ENCOUNTER — Other Ambulatory Visit: Payer: Self-pay | Admitting: Family Medicine

## 2019-08-22 MED ORDER — ASPIRIN 81 MG PO TBEC: 81.0000 mg | DELAYED_RELEASE_TABLET | Freq: Every day | ORAL | 11 refills | Status: AC

## 2019-08-22 MED ORDER — ATORVASTATIN CALCIUM 80 MG PO TABS: 80.0000 mg | ORAL_TABLET | Freq: Every day | ORAL | 11 refills | Status: AC

## 2019-08-22 MED ORDER — CLOPIDOGREL BISULFATE 75 MG PO TABS: 75.0000 mg | ORAL_TABLET | Freq: Every day | ORAL | 11 refills | Status: AC

## 2019-08-22 MED ORDER — ASPIRIN EC 81 MG PO TBEC: 81.0000 mg | DELAYED_RELEASE_TABLET | Freq: Every day | ORAL | 11 refills | Status: AC

## 2019-08-22 MED ORDER — QUETIAPINE FUMARATE 100 MG PO TABS: 100.0000 mg | ORAL_TABLET | Freq: Every day | ORAL | 11 refills | Status: AC

## 2019-08-24 ENCOUNTER — Other Ambulatory Visit: Payer: Self-pay

## 2019-08-24 ENCOUNTER — Emergency Department (HOSPITAL_COMMUNITY): Payer: No Typology Code available for payment source

## 2019-08-24 ENCOUNTER — Encounter (HOSPITAL_COMMUNITY): Payer: Self-pay | Admitting: Emergency Medicine

## 2019-08-24 ENCOUNTER — Emergency Department (HOSPITAL_COMMUNITY)
Admission: EM | Admit: 2019-08-24 | Discharge: 2019-08-24 | Disposition: A | Discharge status: Home / Self Care | Payer: No Typology Code available for payment source | Attending: Emergency Medicine | Admitting: Emergency Medicine

## 2019-08-24 DIAGNOSIS — Z7982 Long term (current) use of aspirin: Secondary | ICD-10-CM | POA: Insufficient documentation

## 2019-08-24 DIAGNOSIS — F17210 Nicotine dependence, cigarettes, uncomplicated: Secondary | ICD-10-CM | POA: Diagnosis not present

## 2019-08-24 DIAGNOSIS — R202 Paresthesia of skin: Secondary | ICD-10-CM | POA: Diagnosis present

## 2019-08-24 DIAGNOSIS — R531 Weakness: Secondary | ICD-10-CM | POA: Insufficient documentation

## 2019-08-24 DIAGNOSIS — Z79899 Other long term (current) drug therapy: Secondary | ICD-10-CM | POA: Diagnosis not present

## 2019-08-24 DIAGNOSIS — Z7902 Long term (current) use of antithrombotics/antiplatelets: Secondary | ICD-10-CM | POA: Diagnosis not present

## 2019-08-24 DIAGNOSIS — I6389 Other cerebral infarction: Secondary | ICD-10-CM | POA: Insufficient documentation

## 2019-08-24 HISTORY — DX: Cerebral infarction, unspecified: I63.9

## 2019-08-24 LAB — I-STAT CHEM 8, ED
BUN: 14 mg/dL (ref 6–20)
Calcium, Ion: 1.26 mmol/L (ref 1.15–1.40)
Chloride: 103 mmol/L (ref 98–111)
Creatinine, Ser: 0.8 mg/dL (ref 0.61–1.24)
Glucose, Bld: 120 mg/dL — ABNORMAL HIGH (ref 70–99)
HCT: 41 % (ref 39.0–52.0)
Hemoglobin: 13.9 g/dL (ref 13.0–17.0)
Potassium: 3.4 mmol/L — ABNORMAL LOW (ref 3.5–5.1)
Sodium: 141 mmol/L (ref 135–145)
TCO2: 26 mmol/L (ref 22–32)

## 2019-08-24 LAB — DIFFERENTIAL
Abs Immature Granulocytes: 0.01 10*3/uL (ref 0.00–0.07)
Basophils Absolute: 0 10*3/uL (ref 0.0–0.1)
Basophils Relative: 1 %
Eosinophils Absolute: 0 10*3/uL (ref 0.0–0.5)
Eosinophils Relative: 1 %
Immature Granulocytes: 0 %
Lymphocytes Relative: 35 %
Lymphs Abs: 1.2 10*3/uL (ref 0.7–4.0)
Monocytes Absolute: 0.3 10*3/uL (ref 0.1–1.0)
Monocytes Relative: 8 %
Neutro Abs: 1.9 10*3/uL (ref 1.7–7.7)
Neutrophils Relative %: 55 %

## 2019-08-24 LAB — LIPID PANEL
Cholesterol: 144 mg/dL (ref 0–200)
HDL: 53 mg/dL (ref >40)
LDL Cholesterol: 83 mg/dL (ref 0–99)
Total CHOL/HDL Ratio: 2.7 RATIO
Triglycerides: 40 mg/dL (ref <150)
VLDL: 8 mg/dL (ref 0–40)

## 2019-08-24 LAB — CBC
HCT: 40.5 % (ref 39.0–52.0)
Hemoglobin: 13.1 g/dL (ref 13.0–17.0)
MCH: 26.5 pg (ref 26.0–34.0)
MCHC: 32.3 g/dL (ref 30.0–36.0)
MCV: 81.8 fL (ref 80.0–100.0)
Platelets: 191 10*3/uL (ref 150–400)
RBC: 4.95 MIL/uL (ref 4.22–5.81)
RDW: 14.7 % (ref 11.5–15.5)
WBC: 3.4 10*3/uL — ABNORMAL LOW (ref 4.0–10.5)
nRBC: 0 % (ref 0.0–0.2)

## 2019-08-24 LAB — APTT: aPTT: 34 seconds (ref 24–36)

## 2019-08-24 LAB — COMPREHENSIVE METABOLIC PANEL
ALT: 18 U/L (ref 0–44)
AST: 20 U/L (ref 15–41)
Albumin: 3.7 g/dL (ref 3.5–5.0)
Alkaline Phosphatase: 61 U/L (ref 38–126)
Anion gap: 9 (ref 5–15)
BUN: 12 mg/dL (ref 6–20)
CO2: 26 mmol/L (ref 22–32)
Calcium: 9.4 mg/dL (ref 8.9–10.3)
Chloride: 105 mmol/L (ref 98–111)
Creatinine, Ser: 0.84 mg/dL (ref 0.61–1.24)
GFR calc Af Amer: >60 mL/min (ref >60)
GFR calc non Af Amer: >60 mL/min (ref >60)
Glucose, Bld: 124 mg/dL — ABNORMAL HIGH (ref 70–99)
Potassium: 3.4 mmol/L — ABNORMAL LOW (ref 3.5–5.1)
Sodium: 140 mmol/L (ref 135–145)
Total Bilirubin: 0.6 mg/dL (ref 0.3–1.2)
Total Protein: 6.9 g/dL (ref 6.5–8.1)

## 2019-08-24 LAB — HEMOGLOBIN A1C
Hgb A1c MFr Bld: 6.5 % — ABNORMAL HIGH (ref 4.8–5.6)
Mean Plasma Glucose: 139.85 mg/dL

## 2019-08-24 LAB — PROTIME-INR
INR: 1 (ref 0.8–1.2)
Prothrombin Time: 12.8 seconds (ref 11.4–15.2)

## 2019-08-24 MED ORDER — SODIUM CHLORIDE 0.9% FLUSH: 3.0000 mL | Freq: Once | INTRAVENOUS | Status: DC

## 2019-08-24 NOTE — ED Triage Notes (Signed)
Discharged from hospital on Friday for CVA.  States he has R sided deficits from previous stroke.  Woke up this morning with L sided arm and leg numbness/tingling.  No arm drift.  VAN negative.  LKW 7pm last night when he went to bed.

## 2019-08-24 NOTE — Consult Note (Addendum)
NEURO HOSPITALIST  CONSULT   Requesting Physician: Dr. Manus Gunning    Chief Complaint: left hand and foot tingling/ numbness  History obtained from:  Patient    HPI:                                                                                                                                         [name] is an 53 y.o. male  With PMH significant for tobacco abuse, CVA ( 08/21/2019 thalamic infarct), PTSD who presented to [facility] ED for c/o left hand and foot tingling and numbness.    Patient was just admitted and discharged 08/21/2019 for right side numbness and weakness. At that time MRI showed a left thalamic stroke. Today patient noticed that his  left finger tips and left foot were tingling and numb. It had resolved at the time of my exam. He stated that he was worried because his discharge papers say that  He is at greater risk of having a stroke for the next 30 days. He was discharged on ASA, plavix and atorvastatin. He had not yet started taking the ASA, but was taking everything else.  Patient still with paresthesias of right arm and face from thalamic stroke as well as right facial droop. Patient denies ETOH, no longer smokes, drug abuse, CP, SOB, vision changes.   ED course:  CTH: no hemorrhage MRI: nothing acute, unchanged since last MRI. Modified Rankin: Rankin Score=0 NIHSS:1   Past Medical History:  Diagnosis Date  . PTSD (post-traumatic stress disorder)   . Stroke [facility])     Past Surgical History:  Procedure Laterality Date  . BACK SURGERY    . DENTAL SURGERY      Family History  Problem Relation Age of Onset  . Depression Father          Social History:  reports that he has been smoking cigarettes. He has a 6.80 pack-year smoking history. He has never used smokeless tobacco. He reports previous alcohol use of about 6.0 standard drinks of alcohol per week. He reports previous drug use.  Allergies:  Allergies   Allergen Reactions  . Bee Venom Swelling  . Citrus Swelling    Medications:  Current Facility-Administered Medications  Medication Dose Route Frequency Provider Last Rate Last Dose  . sodium chloride flush (NS) 0.9 % injection 3 mL  3 mL Intravenous Once Rancour, Jeannett Senior, MD       Current Outpatient Medications  Medication Sig Dispense Refill  . aspirin 81 MG EC tablet Take 1 tablet (81 mg total) by mouth daily with breakfast. 30 tablet 11  . aspirin EC 81 MG tablet Take 1 tablet (81 mg total) by mouth daily with breakfast. 30 tablet 11  . atorvastatin (LIPITOR) 80 MG tablet Take 1 tablet (80 mg total) by mouth daily at 6 PM. 30 tablet 11  . atorvastatin (LIPITOR) 80 MG tablet Take 1 tablet (80 mg total) by mouth daily. 30 tablet 11  . buPROPion (WELLBUTRIN XL) 300 MG 24 hr tablet Take 300 mg by mouth daily after breakfast.    . clopidogrel (PLAVIX) 75 MG tablet Take 1 tablet (75 mg total) by mouth daily. 30 tablet 11  . clopidogrel (PLAVIX) 75 MG tablet Take 1 tablet (75 mg total) by mouth daily. 30 tablet 11  . clopidogrel (PLAVIX) 75 MG tablet Take 1 tablet (75 mg total) by mouth daily. 30 tablet 11  . clopidogrel (PLAVIX) 75 MG tablet Take 1 tablet (75 mg total) by mouth daily. 30 tablet 11  . gabapentin (NEURONTIN) 800 MG tablet Take 800 mg by mouth at bedtime.    Marland Kitchen QUEtiapine (SEROQUEL) 100 MG tablet Take 100 mg by mouth at bedtime as needed (sleep and anxiety).    . QUEtiapine (SEROQUEL) 100 MG tablet Take 1 tablet (100 mg total) by mouth at bedtime. 30 tablet 11  . sertraline (ZOLOFT) 100 MG tablet Take 100 mg by mouth daily after breakfast.      ROS:                                                                                                                                       ROS was performed and is negative except as noted in HPI    General  Examination:                                                                                                      Blood pressure (!) 145/102, pulse (!) 56, temperature 98.2 F (36.8 C), temperature source Oral, resp. rate 14, SpO2 98 %.  Physical Exam  Constitutional: Appears well-developed and well-nourished.  Psych: Affect appropriate to situation Eyes: Normal external eye and conjunctiva. HENT: Normocephalic, no  lesions, without obvious abnormality.   Musculoskeletal-no joint tenderness, deformity or swelling Cardiovascular: Normal rate and regular rhythm.  Respiratory: Effort normal, non-labored breathing saturations WNL on RA GI: Soft.  No distension. There is no tenderness.  Skin: WDI  Neurological Examination Mental Status: Alert, oriented, thought content appropriate.  Speech fluent without evidence of aphasia.  Able to follow  commands without difficulty. Cranial Nerves: II: Visual fields grossly normal,  III,IV, VI: ptosis not present, extra-ocular motions intact bilaterally, pupils equal, round, reactive to light and accommodation V,VII: smile asymmetric, right facial droop,  facial light touch sensation normal bilaterally VIII: hearing normal bilaterally IX,X: uvula rises midline XI: bilateral shoulder shrug XII: midline tongue extension Motor: Right : Upper extremity   5/5 Left:     Upper extremity   5/5  Lower extremity   5/5  Lower extremity   5/5 Tone and bulk:normal tone throughout; no atrophy noted Sensory: vibratory, cool temp, and light touch intact throughout, bilaterally Deep Tendon Reflexes: 2+ and symmetric biceps and patella, 1+ achilles Plantars: Right: downgoing   Left: downgoing Cerebellar: No ataxia noted Gait: deferred   Lab Results: Basic Metabolic Panel: Recent Labs  Lab 08/20/19 0848 08/20/19 1810 08/24/19 0951 08/24/19 1003  NA 138  --  140 141  K 3.5  --  3.4* 3.4*  CL 105  --  105 103  CO2 23  --  26  --   GLUCOSE 102*  --  124*  120*  BUN 17  --  12 14  CREATININE 0.85 0.82 0.84 0.80  CALCIUM 9.2  --  9.4  --     CBC: Recent Labs  Lab 08/20/19 0848 08/20/19 1810 08/24/19 0951 08/24/19 1003  WBC 4.2 4.4 3.4*  --   NEUTROABS 2.4  --  1.9  --   HGB 13.4 12.7* 13.1 13.9  HCT 42.3 40.4 40.5 41.0  MCV 83.6 83.5 81.8  --   PLT 200 185 191  --       Imaging: Ct Head Wo Contrast  Result Date: 08/24/2019 CLINICAL DATA:  Left arm/hand numbness.  Extreme fatigue. EXAM: CT HEAD WITHOUT CONTRAST TECHNIQUE: Contiguous axial images were obtained from the base of the skull through the vertex without intravenous contrast. COMPARISON:  08/20/2019; brain MRI-08/20/2019 FINDINGS: Brain: Old lacunar infarcts within the bilateral basal ganglia, left greater than right (left-image 14, series 3; right-image 12, series 3) without interval change in size, mass effect or interval hemorrhage. Scattered minimal periventricular hypodensities compatible with microvascular ischemic disease. No CT evidence of acute large territory infarct. No intraparenchymal or extra-axial mass or hemorrhage. Normal size and configuration of the ventricles and the basilar cisterns. No midline shift. Vascular: No hyperdense vessel or unexpected calcification. Skull: No displaced calvarial fracture. Sinuses/Orbits: Limited visualization the paranasal sinuses and mastoid air cells is normal. No air-fluid levels. Other: Regional soft tissues appear normal. IMPRESSION: 1. No acute intracranial process. 2. Stable sequela of prior bilateral basal gangliar infarcts and microvascular ischemic disease. Electronically Signed   By: Simonne ComeJohn  Watts M.D.   On: 08/24/2019 10:50       Valentina LucksJessica Williams, MSN, NP-C [facility] [phone]  08/24/2019, 1:43 PM   Attending physician note to follow with Assessment and plan .   Assessment:  [name] is an 53 y.o. male  With PMH significant for tobacco abuse, CVA ( 08/21/2019 thalamic infarct), PTSD who  presented to [facility] ED for c/o left hand and foot tingling and numbness. Patient is already taking plavix.  Also on atorvastatin. Stroke-work up was recently completed 08/21/2019. F/u with outpatient neurology. Stroke Risk Factors - smoking    Recommendations: --Continue Asa and plavix  For 3 weeks, and then continue on ASA only. -- f/u with outpatient neurology per d/c instructions.   --please page stroke NP  Or  PA  Or MD from 8am -4 pm  as this patient from this time will be  followed by the stroke.   You can look them up on [email or web address]  Password [email or web address]    NEUROHOSPITALIST ADDENDUM Performed a face to face diagnostic evaluation.   I have reviewed the contents of history and physical exam as documented by PA/ARNP/Resident and agree with above documentation.  I have discussed and formulated the above plan as documented. Edits to the note have been made as needed.  Patient presents with nonspecific paresthesias in the left arm and leg now resolved.  Possibly TIA however has already had been complete stroke work-up.  Patient should be on dual antiplatelet therapy however is not resume aspirin.  We recommended that he does continue both antiplatelets for 3 weeks and then aspirin alone.  Patient seem to voiced understanding.  He has a follow-up with his [place] doctor tomorrow.  Recommend that he get the A1c and lipid profile    Karena Addison  MD [facility] [phone]   If 7pm to 7am, please call on call as listed on AMION.

## 2019-08-24 NOTE — ED Provider Notes (Signed)
[facility] [facility] EMERGENCY DEPARTMENT Provider Note   CSN: [id number] Arrival date & time: 08/24/19  0630     History   Chief Complaint Chief Complaint  Patient presents with   Stroke Symptoms    HPI [name] is a 53 y.o. male.     Patient presents from home with "tingling" to his left fingertips and toes onset this morning.  This onset while he was walking around after waking up this morning.  The tingling has since resolved.  He is not sure how long it lasted.  History is notable for recent left thalamic infarct.  Patient was discharged in the hospital 3 days ago.  He has chronic right-sided weakness and numbness which is unchanged.  Today he was concerned when he woke up he noticed there was some numbness on the left side but no weakness.  No difficulty speaking or difficulty swallowing.  No chest pain, shortness of breath, headache.  States compliance with his medications including Plavix but yet not taken his aspirin.  He feels the numbness in his fingertips is since resolved.  The history is provided by the patient.    Past Medical History:  Diagnosis Date   PTSD (post-traumatic stress disorder)    Stroke [facility])     Patient Active Problem List   Diagnosis Date Noted   Thalamic stroke ([place]) 08/20/2019    Past Surgical History:  Procedure Laterality Date   BACK SURGERY     DENTAL SURGERY          Home Medications    Prior to Admission medications   Medication Sig Start Date End Date Taking? Authorizing Provider  aspirin 81 MG EC tablet Take 1 tablet (81 mg total) by mouth daily with breakfast. 08/22/19   Roxan Hockey, MD  aspirin EC 81 MG tablet Take 1 tablet (81 mg total) by mouth daily with breakfast. 08/22/19 08/21/20  Roxan Hockey, MD  atorvastatin (LIPITOR) 80 MG tablet Take 1 tablet (80 mg total) by mouth daily at 6 PM. 08/22/19   Emokpae, Courage, MD  atorvastatin (LIPITOR) 80 MG tablet Take 1 tablet (80 mg total) by  mouth daily. 08/22/19 08/21/20  Roxan Hockey, MD  buPROPion (WELLBUTRIN XL) 300 MG 24 hr tablet Take 300 mg by mouth daily after breakfast.    [provider]  clopidogrel (PLAVIX) 75 MG tablet Take 1 tablet (75 mg total) by mouth daily. 08/22/19   Roxan Hockey, MD  clopidogrel (PLAVIX) 75 MG tablet Take 1 tablet (75 mg total) by mouth daily. 08/22/19 08/21/20  Roxan Hockey, MD  clopidogrel (PLAVIX) 75 MG tablet Take 1 tablet (75 mg total) by mouth daily. 08/22/19 08/21/20  Roxan Hockey, MD  clopidogrel (PLAVIX) 75 MG tablet Take 1 tablet (75 mg total) by mouth daily. 08/22/19 08/21/20  Roxan Hockey, MD  gabapentin (NEURONTIN) 800 MG tablet Take 800 mg by mouth at bedtime.    [provider]  QUEtiapine (SEROQUEL) 100 MG tablet Take 100 mg by mouth at bedtime as needed (sleep and anxiety).    [provider]  QUEtiapine (SEROQUEL) 100 MG tablet Take 1 tablet (100 mg total) by mouth at bedtime. 08/22/19   Roxan Hockey, MD  sertraline (ZOLOFT) 100 MG tablet Take 100 mg by mouth daily after breakfast.    [provider]    Family History Family History  Problem Relation Age of Onset   Depression Father     Social History Social History   Tobacco Use  Smoking status: Current Every Day Smoker    Packs/day: 0.20    Years: 34.00    Pack years: 6.80    Types: Cigarettes   Smokeless tobacco: Never Used  Substance Use Topics   Alcohol use: Not Currently    Alcohol/week: 6.0 standard drinks    Types: 6 Cans of beer per week   Drug use: Not Currently     Allergies   Bee venom and Citrus   Review of Systems Review of Systems  Constitutional: Negative for activity change, appetite change and fever.  HENT: Negative for congestion.   Respiratory: Negative for chest tightness and shortness of breath.   Cardiovascular: Negative for chest pain and leg swelling.  Gastrointestinal: Negative for abdominal pain, nausea and  vomiting.  Genitourinary: Negative for dysuria and hematuria.  Musculoskeletal: Negative for arthralgias and myalgias.  Skin: Negative for rash.  Neurological: Positive for weakness and numbness. Negative for syncope, speech difficulty and light-headedness.    all other systems are negative except as noted in the HPI and PMH.    Physical Exam Updated Vital Signs BP (!) 149/95    Pulse 70    Temp 98.2 F (36.8 C) (Oral)    Resp (!) 21    SpO2 99%   Physical Exam Vitals signs and nursing note reviewed.  Constitutional:      General: He is not in acute distress.    Appearance: He is well-developed.  HENT:     Head: Normocephalic and atraumatic.     Mouth/Throat:     Pharynx: No oropharyngeal exudate.  Eyes:     Conjunctiva/sclera: Conjunctivae normal.     Pupils: Pupils are equal, round, and reactive to light.  Neck:     Musculoskeletal: Normal range of motion and neck supple.     Comments: No meningismus. Cardiovascular:     Rate and Rhythm: Normal rate and regular rhythm.     Heart sounds: Normal heart sounds. No murmur.  Pulmonary:     Effort: Pulmonary effort is normal. No respiratory distress.     Breath sounds: Normal breath sounds.  Abdominal:     Palpations: Abdomen is soft.     Tenderness: There is no abdominal tenderness. There is no guarding or rebound.  Musculoskeletal: Normal range of motion.        General: No tenderness.  Skin:    General: Skin is warm.  Neurological:     Mental Status: He is alert and oriented to person, place, and time.     Cranial Nerves: No cranial nerve deficit.     Motor: No abnormal muscle tone.     Coordination: Coordination normal.     Comments: Cranial nerves II to XII intact, tongue is midline. 4/5 strength of right upper extremity and right lower extremity compared to 5/5 strength on the left. No ataxia in finger-to-nose.  No pronator drift.  Subjective sensory changes to left hand and left foot.  Psychiatric:         Behavior: Behavior normal.      ED Treatments / Results  Labs (all labs ordered are listed, but only abnormal results are displayed) Labs Reviewed  CBC - Abnormal; Notable for the following components:      Result Value   WBC 3.4 (*)    All other components within normal limits  COMPREHENSIVE METABOLIC PANEL - Abnormal; Notable for the following components:   Potassium 3.4 (*)    Glucose, Bld 124 (*)    All other components  within normal limits  HEMOGLOBIN A1C - Abnormal; Notable for the following components:   Hgb A1c MFr Bld 6.5 (*)    All other components within normal limits  I-STAT CHEM 8, ED - Abnormal; Notable for the following components:   Potassium 3.4 (*)    Glucose, Bld 120 (*)    All other components within normal limits  PROTIME-INR  APTT  DIFFERENTIAL  LIPID PANEL  CBG MONITORING, ED    EKG EKG Interpretation  Date/Time:  Sunday August 24 2019 09:38:39 EST Ventricular Rate:  75 PR Interval:    QRS Duration: 88 QT Interval:  391 QTC Calculation: 437 R Axis:   19 Text Interpretation: Sinus rhythm Anteroseptal infarct, old No significant change was found Confirmed by Glynn Octaveancour, Primo Innis [phone]) on 08/24/2019 9:42:27 AM   Radiology Ct Head Wo Contrast  Result Date: 08/24/2019 CLINICAL DATA:  Left arm/hand numbness.  Extreme fatigue. EXAM: CT HEAD WITHOUT CONTRAST TECHNIQUE: Contiguous axial images were obtained from the base of the skull through the vertex without intravenous contrast. COMPARISON:  08/20/2019; brain MRI-08/20/2019 FINDINGS: Brain: Old lacunar infarcts within the bilateral basal ganglia, left greater than right (left-image 14, series 3; right-image 12, series 3) without interval change in size, mass effect or interval hemorrhage. Scattered minimal periventricular hypodensities compatible with microvascular ischemic disease. No CT evidence of acute large territory infarct. No intraparenchymal or extra-axial mass or hemorrhage. Normal size and  configuration of the ventricles and the basilar cisterns. No midline shift. Vascular: No hyperdense vessel or unexpected calcification. Skull: No displaced calvarial fracture. Sinuses/Orbits: Limited visualization the paranasal sinuses and mastoid air cells is normal. No air-fluid levels. Other: Regional soft tissues appear normal. IMPRESSION: 1. No acute intracranial process. 2. Stable sequela of prior bilateral basal gangliar infarcts and microvascular ischemic disease. Electronically Signed   By: Simonne ComeJohn  Watts M.D.   On: 08/24/2019 10:50   Mr Brain Wo Contrast  Result Date: 08/24/2019 CLINICAL DATA:  Left-sided paresthesia.  Stroke. EXAM: MRI HEAD WITHOUT CONTRAST TECHNIQUE: Multiplanar, multiecho pulse sequences of the brain and surrounding structures were obtained without intravenous contrast. COMPARISON:  MRI head 08/20/2019 FINDINGS: Brain: Acute infarct left thalamus is unchanged in size compared to the prior study. Adjacent chronic lacunar infarction left thalamus. Two chronic lacunar infarctions in the right medial thalamus. No other significant chronic ischemia. Negative for hemorrhage or mass. Normal ventricular size Vascular: Normal arterial flow voids Skull and upper cervical spine: Negative Sinuses/Orbits: Paranasal sinuses clear. Mild mastoid effusion on the left. Negative orbit. Other: None IMPRESSION: Acute infarct left thalamus unchanged from 4 days ago. Small chronic lacunar infarctions in the thalamus bilaterally also unchanged. No interval change from the prior study. Electronically Signed   By: Marlan Palauharles  Clark M.D.   On: 08/24/2019 13:56    Procedures Procedures (including critical care time)  Medications Ordered in ED Medications  sodium chloride flush (NS) 0.9 % injection 3 mL (has no administration in time range)     Initial Impression / Assessment and Plan / ED Course  I have reviewed the triage vital signs and the nursing notes.  Pertinent labs & imaging results that were  available during my care of the patient were reviewed by me and considered in my medical decision making (see chart for details).       L sided paresthesias this am, now resolved, recent L thalamic infarct. Has been taking plavix but not ASA.  D/w Dr. Laurence SlateAroor of neurology who recommends repeat MRI and he will evaluate.  MRI  does not show any new infarct.  Patient has been seen by neurology Dr. Laurence Slate.  Feels patient can be discharged with outpatient follow-up.  Continue aspirin and Plavix. [facility]. Return precautions discussed.   Final Clinical Impressions(s) / ED Diagnoses   Final diagnoses:  Paresthesia    ED Discharge Orders    None       Donavin Audino, Jeannett Senior, MD 08/24/19 1916

## 2019-08-24 NOTE — ED Notes (Signed)
Patient transported to CT 

## 2019-08-24 NOTE — ED Notes (Signed)
Pt ambulated to the bathroom with a steady gait.

## 2019-08-24 NOTE — Discharge Instructions (Signed)
There is no evidence of new stroke.  Continue the aspirin and Plavix. Follow-up with your neurologist at the [place].  Return to the ED develop new or worsening symptoms.

## 2019-09-10 ENCOUNTER — Ambulatory Visit: Payer: No Typology Code available for payment source | Admitting: Physical Therapy

## 2019-09-10 ENCOUNTER — Ambulatory Visit: Payer: No Typology Code available for payment source | Admitting: Occupational Therapy

## 2019-09-15 ENCOUNTER — Ambulatory Visit: Payer: No Typology Code available for payment source

## 2019-09-23 ENCOUNTER — Ambulatory Visit: Payer: No Typology Code available for payment source | Admitting: Occupational Therapy

## 2019-10-15 ENCOUNTER — Ambulatory Visit: Payer: No Typology Code available for payment source | Admitting: Occupational Therapy

## 2019-10-29 ENCOUNTER — Ambulatory Visit: Payer: No Typology Code available for payment source | Admitting: Occupational Therapy

## 2019-11-06 ENCOUNTER — Ambulatory Visit: Payer: No Typology Code available for payment source | Admitting: Occupational Therapy

## 2020-04-27 IMAGING — MR MR HEAD W/O CM
12 of 13 series · 44 of 48 positions shown · non-contrast
Comparison: MRI head 08/20/2019

CLINICAL DATA: Left-sided paresthesia.  Stroke.

EXAM:
MRI HEAD WITHOUT CONTRAST
TECHNIQUE: Multiplanar, multiecho pulse sequences of the brain and surrounding
structures were obtained without intravenous contrast.

[Series 5: DWI · axial · 3.0mm · 0.92mm/px · z∈[-85,+67]mm · 8 of 104 slices shown (1 of 4)]
[im 1/104]
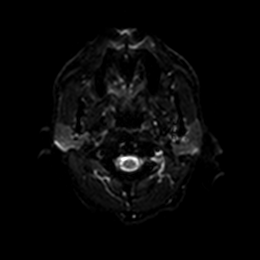
[im 15/104]
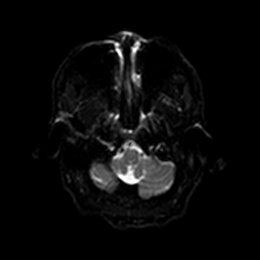
[im 30/104]
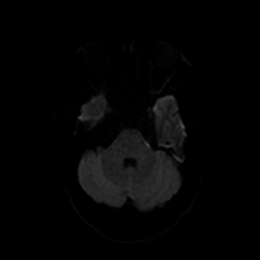
[im 45/104]
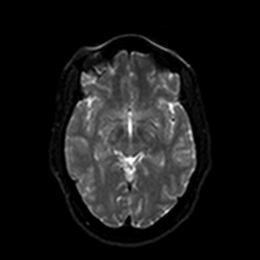
[im 59/104]
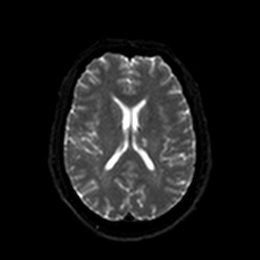
[im 74/104]
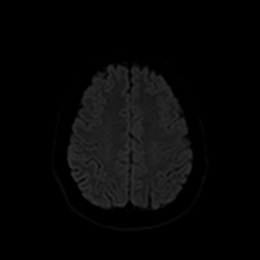
[im 89/104]
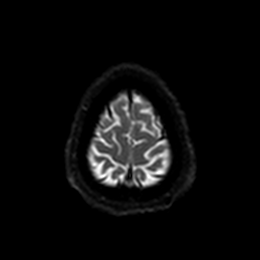
[im 104/104]
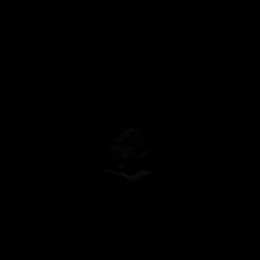

[Series 6: DWI · axial · 3.0mm · 0.92mm/px · z∈[-85,+67]mm · 4 of 49 slices shown (2 of 4)]
[im 1/49]
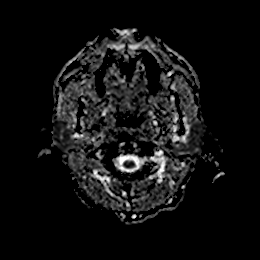
[im 17/49]
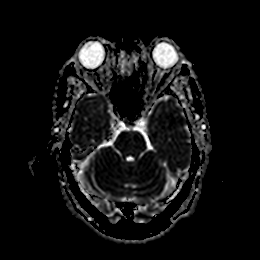
[im 33/49]
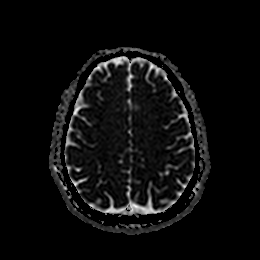
[im 49/49]
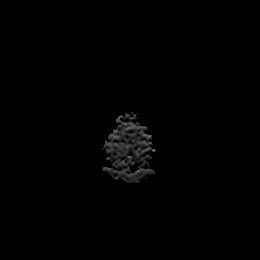

[Series 7: DWI · coronal · 4.0mm · 0.88mm/px · 6 of 74 slices shown (3 of 4)]
[im 1/74]
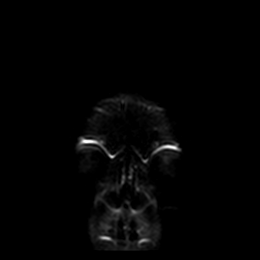
[im 15/74]
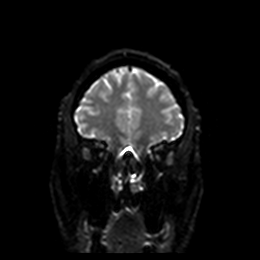
[im 30/74]
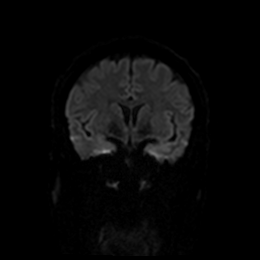
[im 44/74]
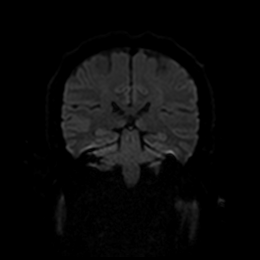
[im 59/74]
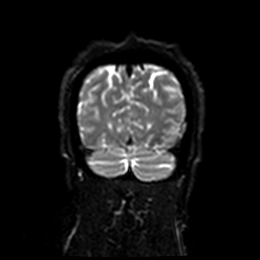
[im 74/74]
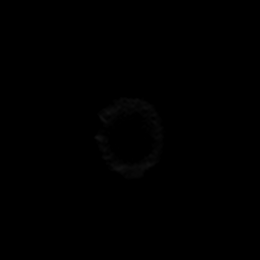

[Series 8: DWI · coronal · 4.0mm · 0.88mm/px · 3 of 37 slices shown (4 of 4)]
[im 1/37]
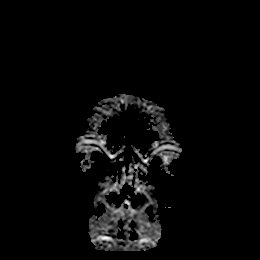
[im 19/37]
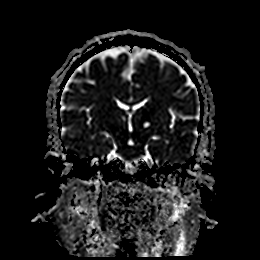
[im 37/37]
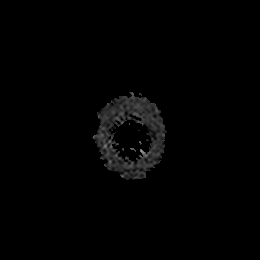

[Series 9: T1 · sagittal · 5.0mm · 0.75mm/px · 2 of 24 slices shown]
[im 1/24]
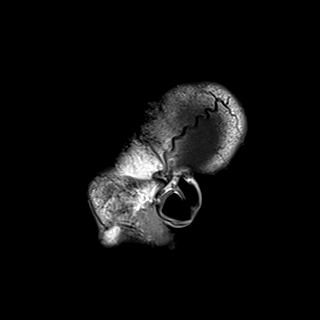
[im 24/24]
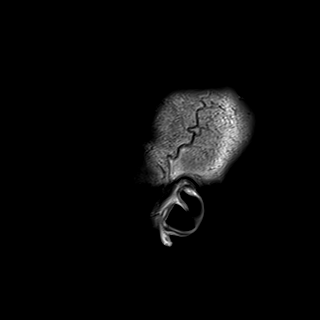

[Series 10: FLAIR · axial · 5.0mm · 0.47mm/px · z∈[-80,+64]mm · 2 of 25 slices shown]
[im 1/25]
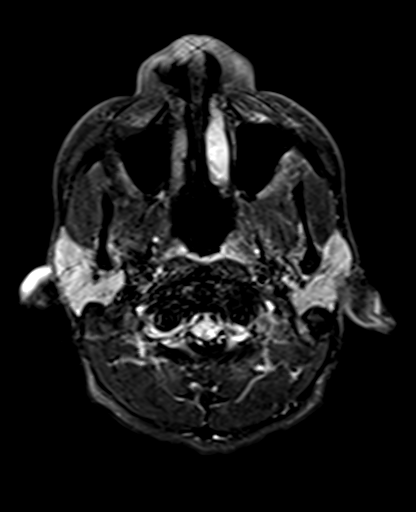
[im 25/25]
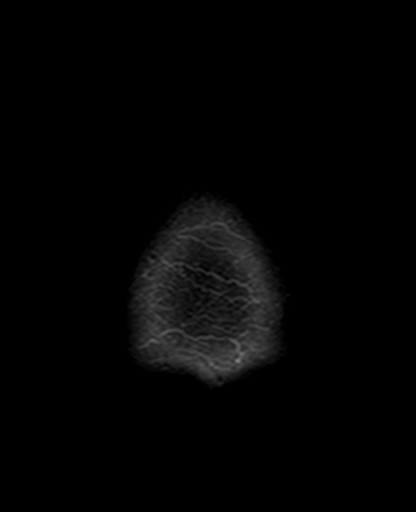

[Series 11: mag_images · axial · 3.0mm · 0.94mm/px · z∈[-84,+68]mm · 4 of 52 slices shown]
[im 1/52]
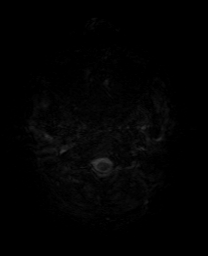
[im 18/52]
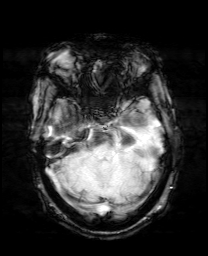
[im 35/52]
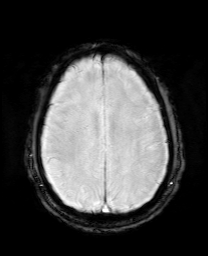
[im 52/52]
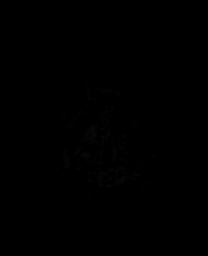

[Series 12: pha_images · axial · 3.0mm · 0.94mm/px · z∈[-84,+62]mm · 4 of 50 slices shown]
[im 1/50]
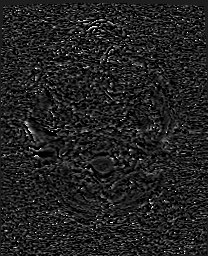
[im 17/50]
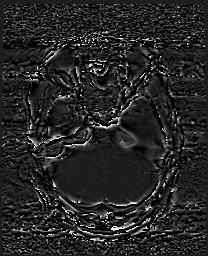
[im 33/50]
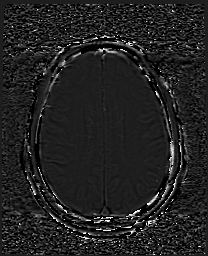
[im 50/50]
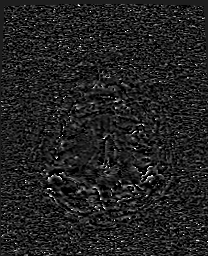

[Series 13: swi_images · axial · 3.0mm · 0.94mm/px · z∈[-84,+68]mm · 4 of 52 slices shown]
[im 1/52]
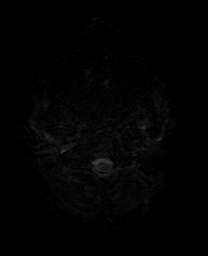
[im 18/52]
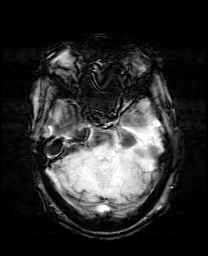
[im 35/52]
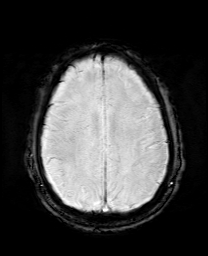
[im 52/52]
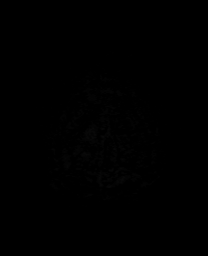

[Series 14: mip_images(sw) · axial · 24.0mm · 0.94mm/px · z∈[-74,+58]mm · 3 of 45 slices shown]
[im 1/45]
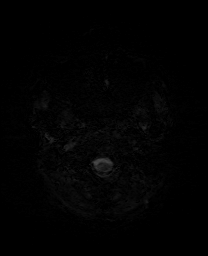
[im 23/45]
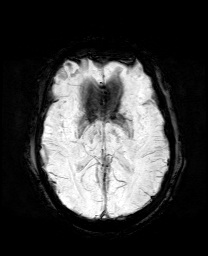
[im 45/45]
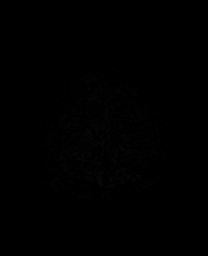

[Series 15: T2 · axial · 5.0mm · 0.75mm/px · z∈[-81,+62]mm · 2 of 25 slices shown (1 of 2)]
[im 1/25]
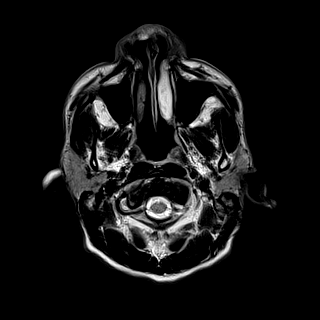
[im 25/25]
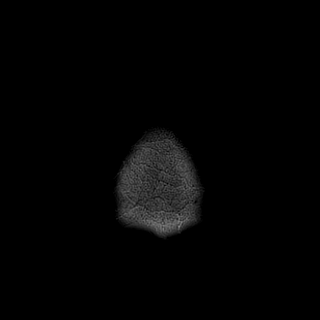

[Series 17: T2 · coronal · 5.0mm · 0.34mm/px · 2 of 31 slices shown (2 of 2)]
[im 1/31]
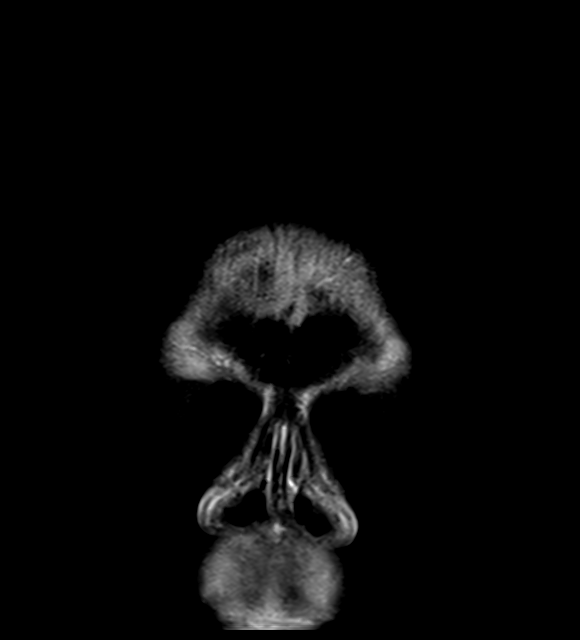
[im 31/31]
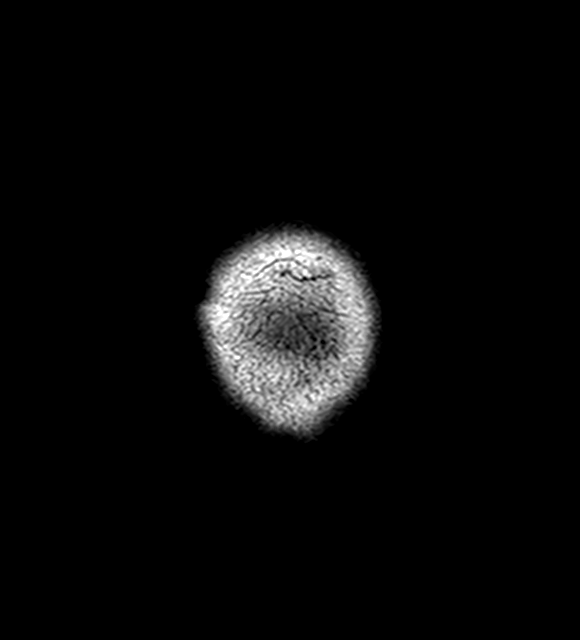

[44 of 48 positions shown; findings below may reference images not displayed]

FINDINGS: Brain: Acute infarct left thalamus is unchanged in size compared to
the prior study. Adjacent chronic lacunar infarction left thalamus.
Two chronic lacunar infarctions in the right medial thalamus.

No other significant chronic ischemia. Negative for hemorrhage or
mass. Normal ventricular size

Vascular: Normal arterial flow voids

Skull and upper cervical spine: Negative

Sinuses/Orbits: Paranasal sinuses clear. Mild mastoid effusion on
the left. Negative orbit.

Other: None
IMPRESSION: Acute infarct left thalamus unchanged from 4 days ago.

Small chronic lacunar infarctions in the thalamus bilaterally also
unchanged. No interval change from the prior study.

## 2022-05-24 ENCOUNTER — Emergency Department (HOSPITAL_COMMUNITY): Payer: No Typology Code available for payment source

## 2022-05-24 ENCOUNTER — Other Ambulatory Visit: Payer: Self-pay

## 2022-05-24 ENCOUNTER — Emergency Department (HOSPITAL_COMMUNITY)
Admission: EM | Admit: 2022-05-24 | Discharge: 2022-05-24 | Disposition: A | Discharge status: Home / Self Care | Payer: No Typology Code available for payment source | Attending: Emergency Medicine | Admitting: Emergency Medicine

## 2022-05-24 ENCOUNTER — Encounter (HOSPITAL_COMMUNITY): Payer: Self-pay | Admitting: Emergency Medicine

## 2022-05-24 DIAGNOSIS — Z7901 Long term (current) use of anticoagulants: Secondary | ICD-10-CM | POA: Diagnosis not present

## 2022-05-24 DIAGNOSIS — R000 Tachycardia, unspecified: Secondary | ICD-10-CM | POA: Insufficient documentation

## 2022-05-24 DIAGNOSIS — Z7982 Long term (current) use of aspirin: Secondary | ICD-10-CM | POA: Insufficient documentation

## 2022-05-24 DIAGNOSIS — T50901A Poisoning by unspecified drugs, medicaments and biological substances, accidental (unintentional), initial encounter: Secondary | ICD-10-CM | POA: Diagnosis present

## 2022-05-24 LAB — CBC WITH DIFFERENTIAL/PLATELET
Abs Immature Granulocytes: 0.05 10*3/uL (ref 0.00–0.07)
Basophils Absolute: 0 10*3/uL (ref 0.0–0.1)
Basophils Relative: 0 %
Eosinophils Absolute: 0 10*3/uL (ref 0.0–0.5)
Eosinophils Relative: 0 %
HCT: 42 % (ref 39.0–52.0)
Hemoglobin: 12.9 g/dL — ABNORMAL LOW (ref 13.0–17.0)
Immature Granulocytes: 1 %
Lymphocytes Relative: 7 %
Lymphs Abs: 0.5 10*3/uL — ABNORMAL LOW (ref 0.7–4.0)
MCH: 26.6 pg (ref 26.0–34.0)
MCHC: 30.7 g/dL (ref 30.0–36.0)
MCV: 86.6 fL (ref 80.0–100.0)
Monocytes Absolute: 0.8 10*3/uL (ref 0.1–1.0)
Monocytes Relative: 10 %
Neutro Abs: 6.4 10*3/uL (ref 1.7–7.7)
Neutrophils Relative %: 82 %
Platelets: 172 10*3/uL (ref 150–400)
RBC: 4.85 MIL/uL (ref 4.22–5.81)
RDW: 16.6 % — ABNORMAL HIGH (ref 11.5–15.5)
WBC: 7.8 10*3/uL (ref 4.0–10.5)
nRBC: 0 % (ref 0.0–0.2)

## 2022-05-24 LAB — COMPREHENSIVE METABOLIC PANEL
ALT: 34 U/L (ref 0–44)
AST: 44 U/L — ABNORMAL HIGH (ref 15–41)
Albumin: 4.1 g/dL (ref 3.5–5.0)
Alkaline Phosphatase: 54 U/L (ref 38–126)
Anion gap: 12 (ref 5–15)
BUN: 20 mg/dL (ref 6–20)
CO2: 26 mmol/L (ref 22–32)
Calcium: 9.1 mg/dL (ref 8.9–10.3)
Chloride: 103 mmol/L (ref 98–111)
Creatinine, Ser: 1.44 mg/dL — ABNORMAL HIGH (ref 0.61–1.24)
GFR, Estimated: 57 mL/min — ABNORMAL LOW (ref >60)
Glucose, Bld: 77 mg/dL (ref 70–99)
Potassium: 4.5 mmol/L (ref 3.5–5.1)
Sodium: 141 mmol/L (ref 135–145)
Total Bilirubin: 0.4 mg/dL (ref 0.3–1.2)
Total Protein: 7.3 g/dL (ref 6.5–8.1)

## 2022-05-24 LAB — I-STAT CHEM 8, ED
BUN: 21 mg/dL — ABNORMAL HIGH (ref 6–20)
Calcium, Ion: 1.18 mmol/L (ref 1.15–1.40)
Chloride: 102 mmol/L (ref 98–111)
Creatinine, Ser: 1.4 mg/dL — ABNORMAL HIGH (ref 0.61–1.24)
Glucose, Bld: 73 mg/dL (ref 70–99)
HCT: 43 % (ref 39.0–52.0)
Hemoglobin: 14.6 g/dL (ref 13.0–17.0)
Potassium: 4.4 mmol/L (ref 3.5–5.1)
Sodium: 140 mmol/L (ref 135–145)
TCO2: 27 mmol/L (ref 22–32)

## 2022-05-24 LAB — I-STAT VENOUS BLOOD GAS, ED
Acid-base deficit: 1 mmol/L (ref 0.0–2.0)
Bicarbonate: 28.3 mmol/L — ABNORMAL HIGH (ref 20.0–28.0)
Calcium, Ion: 1.19 mmol/L (ref 1.15–1.40)
HCT: 43 % (ref 39.0–52.0)
Hemoglobin: 14.6 g/dL (ref 13.0–17.0)
O2 Saturation: 66 %
Potassium: 4.4 mmol/L (ref 3.5–5.1)
Sodium: 140 mmol/L (ref 135–145)
TCO2: 30 mmol/L (ref 22–32)
pCO2, Ven: 70.1 mmHg (ref 44–60)
pH, Ven: 7.214 — ABNORMAL LOW (ref 7.25–7.43)
pO2, Ven: 43 mmHg (ref 32–45)

## 2022-05-24 LAB — ETHANOL: Alcohol, Ethyl (B): <10 mg/dL (ref <10)

## 2022-05-24 MED ORDER — LORAZEPAM 2 MG/ML IJ SOLN: 0.5000 mg | Freq: Once | INTRAMUSCULAR | Status: DC

## 2022-05-24 MED ORDER — SODIUM CHLORIDE 0.9 % IV BOLUS: 500.0000 mL | Freq: Once | INTRAVENOUS | Status: DC

## 2022-05-24 MED ORDER — NALOXONE HCL 0.4 MG/ML IJ SOLN
INTRAMUSCULAR | Status: AC
  Filled 2022-05-24: qty 1

## 2022-05-24 NOTE — ED Notes (Signed)
Pt rolled onto his back but refuses to stay on his back. Patient laying on his left side in the fetal position. This paramedic attempted to hook patient to the 12 lead EKG and was unable to, due to patient being confused and combative. Patient blood work not obtained at this time due to same. Another attempt will be made.

## 2022-05-24 NOTE — ED Notes (Signed)
Help hook patient up to the monitor patient is resting with bed rails up

## 2022-05-24 NOTE — ED Provider Notes (Signed)
[facility] EMERGENCY DEPARTMENT Provider Note   CSN: [id number] Arrival date & time: 05/24/22  1034     History  Chief Complaint  Patient presents with   Drug Overdose    [name] is a 56 y.o. male.  56 year old male with prior medical history as detailed below presents for evaluation.  He arrives by EMS.  EMS reports that the patient was with friends drinking alcohol overnight.  Shortly around 915 the patient's friend left the house and returned approximately 20 minutes later.  At that point the patient was found to be unresponsive with possible apnea.  The patient's friend called 911 and initiated bystander CPR.  EMS administered 5 mg of Narcan intranasally upon arrival to the residence.  The patient is apnea resolved with administration of Narcan.  EMS reports the patient did not lose pulses.  Upon arrival to the ED the patient is agitated but breathing without difficulty.  He is not hypoxic.  He is pupils are pinpoint.  He is unable to provide significant history.  Patient's friend arrived shortly after patient did.  He reports that the patient did not completely stop breathing.  Patient was only drinking alcohol.  Patient without witnessed ingestion of other drugs or use of other drugs.  Patient without traumatic event per the friend's report.  The history is provided by the EMS personnel and medical records.  Drug Overdose       Home Medications Prior to Admission medications   Medication Sig Start Date End Date Taking? Authorizing Provider  aspirin 81 MG EC tablet Take 1 tablet (81 mg total) by mouth daily with breakfast. 08/22/19   Mariea Clonts, Courage, MD  atorvastatin (LIPITOR) 80 MG tablet Take 1 tablet (80 mg total) by mouth daily at 6 PM. 08/22/19   Emokpae, Courage, MD  atorvastatin (LIPITOR) 80 MG tablet Take 1 tablet (80 mg total) by mouth daily. Patient not taking: Reported on 08/24/2019 08/22/19 08/21/20  Shon Hale, MD  buPROPion  (WELLBUTRIN XL) 300 MG 24 hr tablet Take 300 mg by mouth daily after breakfast.    [provider]  clopidogrel (PLAVIX) 75 MG tablet Take 1 tablet (75 mg total) by mouth daily. 08/22/19   Shon Hale, MD  gabapentin (NEURONTIN) 800 MG tablet Take 800 mg by mouth at bedtime.    [provider]  QUEtiapine (SEROQUEL) 100 MG tablet Take 100 mg by mouth at bedtime as needed (sleep and anxiety).    [provider]  QUEtiapine (SEROQUEL) 100 MG tablet Take 1 tablet (100 mg total) by mouth at bedtime. Patient not taking: Reported on 08/24/2019 08/22/19   Shon Hale, MD  sertraline (ZOLOFT) 100 MG tablet Take 100 mg by mouth daily after breakfast.    [provider]      Allergies    Bee venom and Citrus    Review of Systems   Review of Systems  Unable to perform ROS: Acuity of condition    Physical Exam Updated Vital Signs BP 124/73 (BP Location: Right Arm)   Pulse (!) 130   Ht 6\' 2"  (1.88 m)   Wt 86.2 kg   SpO2 100%   BMI 24.39 kg/m  Physical Exam Vitals and nursing note reviewed.  Constitutional:      General: He is not in acute distress.    Appearance: He is well-developed.     Comments: Agitated, not following verbal commands, not responding to verbal questioning  All 4 extremities equally.  No appreciable  facial droop.  Airway reflexes are intact.  HENT:     Head: Normocephalic and atraumatic.  Eyes:     Conjunctiva/sclera: Conjunctivae normal.     Comments: Bilateral pinpoint pupils  Cardiovascular:     Rate and Rhythm: Regular rhythm. Tachycardia present.     Heart sounds: Normal heart sounds.  Pulmonary:     Effort: Pulmonary effort is normal. No respiratory distress.     Breath sounds: Normal breath sounds.  Abdominal:     General: There is no distension.     Palpations: Abdomen is soft.     Tenderness: There is no abdominal tenderness.  Musculoskeletal:        General: No deformity. Normal range of motion.      Cervical back: Normal range of motion and neck supple.  Skin:    General: Skin is warm and dry.  Neurological:     General: No focal deficit present.     Mental Status: He is oriented to person, place, and time.     ED Results / Procedures / Treatments   Labs (all labs ordered are listed, but only abnormal results are displayed) Labs Reviewed  CBC WITH DIFFERENTIAL/PLATELET - Abnormal; Notable for the following components:      Result Value   Hemoglobin 12.9 (*)    RDW 16.6 (*)    Lymphs Abs 0.5 (*)    All other components within normal limits  COMPREHENSIVE METABOLIC PANEL - Abnormal; Notable for the following components:   Creatinine, Ser 1.44 (*)    AST 44 (*)    GFR, Estimated 57 (*)    All other components within normal limits  I-STAT CHEM 8, ED - Abnormal; Notable for the following components:   BUN 21 (*)    Creatinine, Ser 1.40 (*)    All other components within normal limits  I-STAT VENOUS BLOOD GAS, ED - Abnormal; Notable for the following components:   pH, Ven 7.214 (*)    pCO2, Ven 70.1 (*)    Bicarbonate 28.3 (*)    All other components within normal limits  ETHANOL  RAPID URINE DRUG SCREEN, HOSP PERFORMED    EKG None  Radiology No results found.  Procedures Procedures    Medications Ordered in ED Medications  naloxone (NARCAN) 0.4 MG/ML injection (has no administration in time range)  sodium chloride 0.9 % bolus 500 mL (has no administration in time range)    ED Course/ Medical Decision Making/ A&P                           Medical Decision Making Amount and/or Complexity of Data Reviewed Labs: ordered. Radiology: ordered.  Risk Prescription drug management.    Medical Screen Complete  This patient presented to the ED with complaint of suspected overdose.  This complaint involves an extensive number of treatment options. The initial differential diagnosis includes, but is not limited to, alcohol intoxication, opiate overdose,  etc.  This presentation is: Acute, Self-Limited, Previously Undiagnosed, Uncertain Prognosis, Complicated, Systemic Symptoms, and Threat to Life/Bodily Function  Patient presents with history concerning for likely opiate overdose.  Patient observed after EMS administered Narcan.  Patient's mentation improved significantly over the course of his ER evaluation.  No additional Narcan was required.    Patient's alcohol level was negative.  Other studies and imaging was without significant abnormality.  Time of discharge patient is alert and oriented.  He is comfortable.  He desires discharge.  He  repeatedly denies use of street drugs.  He is advised that use of street drugs or other illicit substances could result in death or permanent disability.  Importance of close follow-up is stressed.  Strict return precautions given and understood.   Additional history obtained:  Additional history obtained from EMS and Friend External records from outside sources obtained and reviewed including prior ED visits and prior Inpatient records.    Lab Tests:  I ordered and personally interpreted labs.  The pertinent results include: CBC, EtOH, i-STAT VBG, CMP, i-STAT Chem-8   Imaging Studies ordered:  I ordered imaging studies including CT head, chest x-ray I independently visualized and interpreted obtained imaging which showed NAD I agree with the radiologist interpretation.   Cardiac Monitoring:  The patient was maintained on a cardiac monitor.  I personally viewed and interpreted the cardiac monitor which showed an underlying rhythm of: Sinus tach, NSR  Problem List / ED Course:  Suspected overdose   Reevaluation:  After the interventions noted above, I reevaluated the patient and found that they have: improved   Disposition:  After consideration of the diagnostic results and the patients response to treatment, I feel that the patent would benefit from close outpatient followup.    CRITICAL CARE Performed by: Wynetta Fines   Total critical care time: 30 minutes  Critical care time was exclusive of separately billable procedures and treating other patients.  Critical care was necessary to treat or prevent imminent or life-threatening deterioration.  Critical care was time spent personally by me on the following activities: development of treatment plan with patient and/or surrogate as well as nursing, discussions with consultants, evaluation of patient's response to treatment, examination of patient, obtaining history from patient or surrogate, ordering and performing treatments and interventions, ordering and review of laboratory studies, ordering and review of radiographic studies, pulse oximetry and re-evaluation of patient's condition.          Final Clinical Impression(s) / ED Diagnoses Final diagnoses:  Accidental overdose, initial encounter    Rx / DC Orders ED Discharge Orders     None         Wynetta Fines, MD 05/24/22 1355

## 2022-05-24 NOTE — ED Triage Notes (Signed)
Pt BIB [facility] from home due to being unresponsive.  Friends were home with him, drinking last night and this morning was normal up until 0915 today.  Friend came back in after 15-20 minutes and pt was on the couch unresponsive.  CPR initiated when call was placed.  [facility]  gave 5mg  of Narcan intranasally.  Pt started to move and improve.  Currently on 10 L of oxygen via NRB.  Hx of psych.  VS BP 150/100, HR 110, CBG 101.

## 2022-05-24 NOTE — Discharge Instructions (Signed)
Return for any problem.  Drink alcohol in moderation.
